# Patient Record
Sex: Male | Born: 1975 | Race: Black or African American | Hispanic: No | Marital: Married | Smoking: Never smoker
Health system: Southern US, Community
[De-identification: ages and names within clinical notes are randomized; demographics above are authoritative.]

## PROBLEM LIST (undated history)

## (undated) DIAGNOSIS — E669 Obesity, unspecified: Secondary | ICD-10-CM

## (undated) DIAGNOSIS — G4730 Sleep apnea, unspecified: Secondary | ICD-10-CM

## (undated) DIAGNOSIS — I10 Essential (primary) hypertension: Secondary | ICD-10-CM

## (undated) HISTORY — PX: DIAGNOSTIC LAPAROSCOPY: SUR761

## (undated) HISTORY — PX: THYROID SURGERY: SHX805

## (undated) HISTORY — PX: HERNIA REPAIR: SHX51

---

## 2016-06-16 ENCOUNTER — Emergency Department (HOSPITAL_COMMUNITY): Payer: BLUE CROSS/BLUE SHIELD

## 2016-06-16 ENCOUNTER — Emergency Department (HOSPITAL_COMMUNITY)
Admission: EM | Admit: 2016-06-16 | Discharge: 2016-06-16 | Disposition: A | Discharge status: Home / Self Care | Payer: BLUE CROSS/BLUE SHIELD | Attending: Emergency Medicine | Admitting: Emergency Medicine

## 2016-06-16 ENCOUNTER — Encounter (HOSPITAL_COMMUNITY): Payer: Self-pay

## 2016-06-16 DIAGNOSIS — I10 Essential (primary) hypertension: Secondary | ICD-10-CM | POA: Insufficient documentation

## 2016-06-16 DIAGNOSIS — R079 Chest pain, unspecified: Secondary | ICD-10-CM

## 2016-06-16 DIAGNOSIS — R0789 Other chest pain: Secondary | ICD-10-CM | POA: Diagnosis not present

## 2016-06-16 DIAGNOSIS — R071 Chest pain on breathing: Secondary | ICD-10-CM | POA: Diagnosis present

## 2016-06-16 HISTORY — DX: Essential (primary) hypertension: I10

## 2016-06-16 LAB — I-STAT TROPONIN, ED: Troponin i, poc: 0 ng/mL (ref 0.00–0.08)

## 2016-06-16 LAB — CBC
HEMATOCRIT: 40.1 % (ref 39.0–52.0)
HEMOGLOBIN: 13.3 g/dL (ref 13.0–17.0)
MCH: 23.7 pg — AB (ref 26.0–34.0)
MCHC: 33.2 g/dL (ref 30.0–36.0)
MCV: 71.5 fL — AB (ref 78.0–100.0)
Platelets: 244 10*3/uL (ref 150–400)
RBC: 5.61 MIL/uL (ref 4.22–5.81)
RDW: 15.9 % — AB (ref 11.5–15.5)
WBC: 8.1 10*3/uL (ref 4.0–10.5)

## 2016-06-16 LAB — BASIC METABOLIC PANEL
Anion gap: 8 (ref 5–15)
BUN: 13 mg/dL (ref 6–20)
CHLORIDE: 104 mmol/L (ref 101–111)
CO2: 27 mmol/L (ref 22–32)
Calcium: 9.4 mg/dL (ref 8.9–10.3)
Creatinine, Ser: 0.91 mg/dL (ref 0.61–1.24)
GFR calc Af Amer: >60 mL/min (ref >60)
GLUCOSE: 99 mg/dL (ref 65–99)
POTASSIUM: 4.1 mmol/L (ref 3.5–5.1)
Sodium: 139 mmol/L (ref 135–145)

## 2016-06-16 NOTE — Discharge Instructions (Signed)
SEE YOUR DOCTOR FOR RECHECK IN 2-3 DAYS IF PAIN CONTINUES. RETURN HERE IF SYMPTOMS WORSEN.

## 2016-06-16 NOTE — ED Provider Notes (Signed)
[facility]Y DEPT Provider Note   CSN: [id number] Arrival date & time: 06/16/16  0040     History   Chief Complaint Chief Complaint  Patient presents with  . Chest Pain    HPI Kyle Richmond is a 40 y.o. male.  Patient with a history of HTN presents with complaint of left chest pain that started around 9:00 last evening while at work. He experiences the pain when he takes a deep breath. No cough, fever or SOB. No DOE, nausea or vomiting. No diaphoresis. No history of heart disease. Symptoms are unchanged from time of onset. He was concerned because he has high blood pressure and he wanted to be sure this was not his heart.   The history is provided by the patient. No language interpreter was used.  Chest Pain   Pertinent negatives include no abdominal pain, no diaphoresis, no fever, no nausea and no shortness of breath.    Past Medical History:  Diagnosis Date  . Hypertension     There are no active problems to display for this patient.   Past Surgical History:  Procedure Laterality Date  . THYROID SURGERY         Home Medications    Prior to Admission medications   Not on File    Family History History reviewed. No pertinent family history.  Social History Social History  Substance Use Topics  . Smoking status: Never Smoker  . Smokeless tobacco: Never Used  . Alcohol use Yes     Allergies   Patient has no allergy information on record.   Review of Systems Review of Systems  Constitutional: Negative for chills, diaphoresis and fever.  HENT: Negative.   Respiratory: Negative.  Negative for shortness of breath.        See HPI.  Cardiovascular: Positive for chest pain.  Gastrointestinal: Negative.  Negative for abdominal pain and nausea.  Musculoskeletal: Negative.  Negative for myalgias.  Skin: Negative.   Neurological: Negative.      Physical Exam Updated Vital Signs BP 131/85   Pulse 68   Temp 98.4 F (36.9 C) (Oral)   Resp 13    SpO2 100%   Physical Exam  Constitutional: He is oriented to person, place, and time. He appears well-developed and well-nourished.  Neck: Normal range of motion.  Cardiovascular: Normal rate and regular rhythm.   No murmur heard. Pulmonary/Chest: Effort normal and breath sounds normal. He has no wheezes. He has no rales. He exhibits no tenderness.  Abdominal: Soft. There is no tenderness.  Musculoskeletal: Normal range of motion. He exhibits no edema.  Neurological: He is alert and oriented to person, place, and time.  Skin: Skin is warm and dry.  Psychiatric: He has a normal mood and affect.     ED Treatments / Results  Labs (all labs ordered are listed, but only abnormal results are displayed) Labs Reviewed  CBC - Abnormal; Notable for the following:       Result Value   MCV 71.5 (*)    MCH 23.7 (*)    RDW 15.9 (*)    All other components within normal limits  BASIC METABOLIC PANEL  I-STAT TROPOININ, ED   Results for orders placed or performed during the hospital encounter of 06/16/16  Basic metabolic panel  Result Value Ref Range   Sodium 139 135 - 145 mmol/L   Potassium 4.1 3.5 - 5.1 mmol/L   Chloride 104 101 - 111 mmol/L   CO2 27 22 - 32 mmol/L  Glucose, Bld 99 65 - 99 mg/dL   BUN 13 6 - 20 mg/dL   Creatinine, Ser 1.610.91 0.61 - 1.24 mg/dL   Calcium 9.4 8.9 - 09.610.3 mg/dL   GFR calc non Af Amer >60 >60 mL/min   GFR calc Af Amer >60 >60 mL/min   Anion gap 8 5 - 15  CBC  Result Value Ref Range   WBC 8.1 4.0 - 10.5 K/uL   RBC 5.61 4.22 - 5.81 MIL/uL   Hemoglobin 13.3 13.0 - 17.0 g/dL   HCT 04.540.1 40.939.0 - 81.152.0 %   MCV 71.5 (L) 78.0 - 100.0 fL   MCH 23.7 (L) 26.0 - 34.0 pg   MCHC 33.2 30.0 - 36.0 g/dL   RDW 91.415.9 (H) 78.211.5 - 95.615.5 %   Platelets 244 150 - 400 K/uL  I-stat troponin, ED  Result Value Ref Range   Troponin i, poc 0.00 0.00 - 0.08 ng/mL   Comment 3            EKG  EKG Interpretation None       Radiology Dg Chest 2 View  Result Date:  06/16/2016 CLINICAL DATA:  40 year old male with chest pain EXAM: CHEST  2 VIEW COMPARISON:  None. FINDINGS: The heart size and mediastinal contours are within normal limits. Both lungs are clear. The visualized skeletal structures are unremarkable. Tubing device noted over the upper abdomen . Clinical correlation is recommended. IMPRESSION: No active cardiopulmonary disease. Electronically Signed   By: Elgie CollardArash  Radparvar M.D.   On: 06/16/2016 01:38    Procedures Procedures (including critical care time)  Medications Ordered in ED Medications - No data to display   Initial Impression / Assessment and Plan / ED Course  I have reviewed the triage vital signs and the nursing notes.  Pertinent labs & imaging results that were available during my care of the patient were reviewed by me and considered in my medical decision making (see chart for details).  Clinical Course     Patient with sharp chest pain, worse with deep inspiration, atypical for pain of ACS. Normal EKG, negative troponin. No tachycardia, hypoxia or risk factors for PE.   Suspect musculoskeletal chest pain. Recommend symptomatic treatment and PCP follow up.  Final Clinical Impressions(s) / ED Diagnoses   Final diagnoses:  None  1. Nonspecific chest pain  New Prescriptions New Prescriptions   No medications on file     Elpidio AnisShari Alvita Fana, Cordelia Poche-C 06/16/16 0500    Dione Boozeavid Glick, MD 06/16/16 [phone]

## 2016-06-16 NOTE — ED Triage Notes (Signed)
Pt complaining of chest pain and shortness of breath. Pt complaining of sharp L sided chest pain. Pt denies any injury/trauma. 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

## 2016-06-26 ENCOUNTER — Encounter (HOSPITAL_COMMUNITY): Payer: Self-pay | Admitting: Emergency Medicine

## 2016-06-26 ENCOUNTER — Emergency Department (HOSPITAL_COMMUNITY): Payer: BLUE CROSS/BLUE SHIELD

## 2016-06-26 ENCOUNTER — Emergency Department (HOSPITAL_COMMUNITY)
Admission: EM | Admit: 2016-06-26 | Discharge: 2016-06-26 | Disposition: A | Discharge status: Home / Self Care | Payer: BLUE CROSS/BLUE SHIELD | Attending: Emergency Medicine | Admitting: Emergency Medicine

## 2016-06-26 DIAGNOSIS — I10 Essential (primary) hypertension: Secondary | ICD-10-CM | POA: Diagnosis not present

## 2016-06-26 DIAGNOSIS — M79641 Pain in right hand: Secondary | ICD-10-CM | POA: Insufficient documentation

## 2016-06-26 MED ORDER — OXYCODONE-ACETAMINOPHEN 5-325 MG PO TABS: 1.0000 | ORAL_TABLET | Freq: Four times a day (QID) | ORAL | 0 refills | Status: DC | PRN

## 2016-06-26 MED ORDER — NAPROXEN 500 MG PO TABS: 500.0000 mg | ORAL_TABLET | Freq: Two times a day (BID) | ORAL | 0 refills | Status: DC

## 2016-06-26 MED ORDER — KETOROLAC TROMETHAMINE 60 MG/2ML IM SOLN
60.0000 mg | Freq: Once | INTRAMUSCULAR | Status: AC
  Administered 2016-06-26: 60 mg via INTRAMUSCULAR
  Filled 2016-06-26: qty 2

## 2016-06-26 NOTE — ED Provider Notes (Signed)
Medical screening examination/treatment/procedure(s) were conducted as a shared visit with non-physician practitioner(s) and myself.  I personally evaluated the patient during the encounter.   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 No known injury. X-ray shows no fracture. No numbness or focal weakness. He is tender to palpation over the volar and dorsal aspect of the right hand low the fifth and fourth finger with some soft tissue swelling but no fluctuance, induration, erythema or warmth. No tenderness over the flexor tendon. He has difficulty extending the fingers without significant pain but can do so. Has normal grip strength and normal pulses. Extremities warm and well-perfused. Nothing at this time to suggest flexor tenosynovitis, abscess, felon, paronychia, herpetic whitlow, fracture. Could be tendinitis given he is a Curatormechanic. Will provide him with a work note, short course of narcotics and advised rest, ice, anti-inflammatories. Have discussed with him at length that if he has any symptoms of infection including fever, redness or warmth that he should return immediately to the emergency department. He is not a diabetic or immunocompromised. He is comfortable with this plan.   Layla MawKristen N Halynn Reitano, DO 06/26/16 0630

## 2016-06-26 NOTE — ED Notes (Signed)
Patient transported to X-ray 

## 2016-06-26 NOTE — ED Triage Notes (Addendum)
Pt reports right hand pain that started yesterday morning. 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 Pain not relieved by ice, elevation, or aspirin. Pt reports that he had changed a tire Sunday night but that it was fine until Monday morning. Pt radial pulse 3+, cool to touch, cap refill WNL, noticeable swelling.

## 2016-06-26 NOTE — ED Provider Notes (Signed)
[facility] Provider Note   CSN: [id number] Arrival date & time: 06/26/16  0426     History   Chief Complaint Chief Complaint  Patient presents with  . Hand Pain    HPI Kyle Richmond is a 41 y.o. male.  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 Pain was milder at onset and worsened. He is unable to sleep because of the pain. It is severely tender with palpation and movement. He has tried icing the hand and elevating it. He has tried aspirin without relief. No wrist or proximal upper extremity pain. Denies redness or wounds. Denies fever.      Past Medical History:  Diagnosis Date  . Hypertension     There are no active problems to display for this patient.   Past Surgical History:  Procedure Laterality Date  . THYROID SURGERY         Home Medications    Prior to Admission medications   Not on File    Family History No family history on file.  Social History Social History  Substance Use Topics  . Smoking status: Never Smoker  . Smokeless tobacco: Never Used  . Alcohol use Yes     Allergies   Patient has no allergy information on record.   Review of Systems Review of Systems  Constitutional: Negative for activity change.  Musculoskeletal: Positive for arthralgias and joint swelling. Negative for back pain, gait problem and neck pain.  Skin: Negative for wound.  Neurological: Negative for weakness and numbness.     Physical Exam Updated Vital Signs BP 138/95 (BP Location: Left Arm)   Pulse 77   Temp 97.9 F (36.6 C) (Oral)   Resp 16   Ht 5\' 9"  (1.753 m)   Wt 117.9 kg   SpO2 100%   BMI 38.40 kg/m   Physical Exam  Constitutional: He appears well-developed and well-nourished.  HENT:  Head: Normocephalic and atraumatic.  Eyes: Conjunctivae are normal.  Neck: Normal range of motion. Neck supple.  Cardiovascular: Normal pulses.  Exam reveals  no decreased pulses.   Pulses:      Radial pulses are 2+ on the right side, and 2+ on the left side.  Musculoskeletal: He exhibits tenderness. He exhibits no edema.       Right shoulder: Normal.       Right elbow: Normal.      Right wrist: Normal.       Right forearm: Normal.       Right hand: He exhibits decreased range of motion and tenderness. He exhibits normal capillary refill and no deformity. Normal sensation noted.       Hands: Neurological: He is alert. No sensory deficit.  Motor, sensation, and vascular distal to the injury is fully intact.   Skin: Skin is warm and dry.  Psychiatric: He has a normal mood and affect.  Nursing note and vitals reviewed.    ED Treatments / Results   Radiology Dg Hand Complete Right  Result Date: 06/26/2016 CLINICAL DATA:  Nontraumatic pain and swelling of the hand EXAM: RIGHT HAND - COMPLETE 3+ VIEW COMPARISON:  None. FINDINGS: There is no evidence of fracture or dislocation. There is no evidence of arthropathy or other focal bone abnormality. Soft tissues are unremarkable. IMPRESSION: Negative. Electronically Signed   By: Ellery Plunkaniel R Mitchell M.D.   On: 06/26/2016 05:44    Procedures Procedures (including critical care time)  Medications Ordered  in ED Medications  ketorolac (TORADOL) injection 60 mg (not administered)     Initial Impression / Assessment and Plan / ED Course  I have reviewed the triage vital signs and the nursing notes.  Pertinent labs & imaging results that were available during my care of the patient were reviewed by me and considered in my medical decision making (see chart for details).  Clinical Course    Patient seen and examined. Pain seems to be out-of-proportion to exam without erythema or warmth to suggest infection. IM toradol and x-ray.   Vital signs reviewed and are as follows: BP 138/95 (BP Location: Left Arm)   Pulse 77   Temp 97.9 F (36.6 C) (Oral)   Resp 16   Ht 5\' 9"  (1.753 m)   Wt 117.9 kg    SpO2 100%   BMI 38.40 kg/m   6:33 AM Patient d/w and seen by Dr. Elesa Massed. Agrees does not appear infected.   Plan: RICE, NSAIDs, pain medication, no working x 3 days.   Encouraged PCP f/u if not improved in 1 week.   Pt urged to return with worsening pain, worsening swelling, expanding area of redness or streaking up extremity, fever, or any other concerns.  Patient counseled on use of narcotic pain medications. Counseled not to combine these medications with others containing tylenol. Urged not to drink alcohol, drive, or perform any other activities that requires focus while taking these medications. The patient verbalizes understanding and agrees with the plan.   Final Clinical Impressions(s) / ED Diagnoses   Final diagnoses:  Pain of right hand   Inflammation in hand, 5th finger. X-ray neg. Not red, warm, or cellulitic. Doubt flexor tenosynovitis or cellulitis. Non-toxic. Treatment/follow-up as above.    New Prescriptions New Prescriptions   NAPROXEN (NAPROSYN) 500 MG TABLET    Take 1 tablet (500 mg total) by mouth 2 (two) times daily.   OXYCODONE-ACETAMINOPHEN (PERCOCET/ROXICET) 5-325 MG TABLET    Take 1-2 tablets by mouth every 6 (six) hours as needed for severe pain.     Renne Crigler, PA-C 06/26/16 [phone]

## 2016-06-26 NOTE — Discharge Instructions (Signed)
Please read and follow all provided instructions.  Your diagnoses today include:  1. Pain of right hand    Tests performed today include:  An x-ray of the affected area - does NOT show any broken bones  Vital signs. See below for your results today.   Medications prescribed:   Percocet (oxycodone/acetaminophen) - narcotic pain medication  DO NOT drive or perform any activities that require you to be awake and alert because this medicine can make you drowsy. BE VERY CAREFUL not to take multiple medicines containing Tylenol (also called acetaminophen). Doing so can lead to an overdose which can damage your liver and cause liver failure and possibly death.   Naproxen - anti-inflammatory pain medication  Do not exceed 500mg  naproxen every 12 hours, take with food  You have been prescribed an anti-inflammatory medication or NSAID. Take with food. Take smallest effective dose for the shortest duration needed for your pain. Stop taking if you experience stomach pain or vomiting.   Take any prescribed medications only as directed.  Home care instructions:   Follow any educational materials contained in this packet  Follow R.I.C.E. Protocol:  R - rest your injury   I  - use ice on injury without applying directly to skin  C - compress injury with bandage or splint  E - elevate the injury as much as possible  Follow-up instructions: Please follow-up with your primary care provider if you continue to have significant pain in 1 week. In this case you may have a more severe injury that requires further care.   Return instructions:   Please return if your fingers are numb or tingling, appear gray or blue, or you have severe pain (also elevate the arm and loosen splint or wrap if you were given one)  Return with worsening redness, swelling, drainage.   Please return to the Emergency Department if you experience worsening symptoms.   Please return if you have any other emergent  concerns.  Additional Information:  Your vital signs today were: BP 138/95 (BP Location: Left Arm)    Pulse 77    Temp 97.9 F (36.6 C) (Oral)    Resp 16    Ht 5\' 9"  (1.753 m)    Wt 117.9 kg    SpO2 100%    BMI 38.40 kg/m  If your blood pressure (BP) was elevated above 135/85 this visit, please have this repeated by your doctor within one month. --------------

## 2018-01-15 ENCOUNTER — Emergency Department (HOSPITAL_COMMUNITY)
Admission: EM | Admit: 2018-01-15 | Discharge: 2018-01-15 | Disposition: A | Discharge status: Home / Self Care | Payer: BLUE CROSS/BLUE SHIELD | Attending: Emergency Medicine | Admitting: Emergency Medicine

## 2018-01-15 ENCOUNTER — Encounter (HOSPITAL_COMMUNITY): Payer: Self-pay | Admitting: *Deleted

## 2018-01-15 ENCOUNTER — Other Ambulatory Visit: Payer: Self-pay

## 2018-01-15 ENCOUNTER — Emergency Department (HOSPITAL_COMMUNITY): Payer: BLUE CROSS/BLUE SHIELD

## 2018-01-15 DIAGNOSIS — K5900 Constipation, unspecified: Secondary | ICD-10-CM | POA: Diagnosis not present

## 2018-01-15 DIAGNOSIS — R1030 Lower abdominal pain, unspecified: Secondary | ICD-10-CM | POA: Diagnosis present

## 2018-01-15 DIAGNOSIS — R42 Dizziness and giddiness: Secondary | ICD-10-CM | POA: Insufficient documentation

## 2018-01-15 DIAGNOSIS — Z79899 Other long term (current) drug therapy: Secondary | ICD-10-CM | POA: Diagnosis not present

## 2018-01-15 DIAGNOSIS — R109 Unspecified abdominal pain: Secondary | ICD-10-CM

## 2018-01-15 HISTORY — DX: Obesity, unspecified: E66.9

## 2018-01-15 LAB — CBG MONITORING, ED: GLUCOSE-CAPILLARY: 73 mg/dL (ref 70–99)

## 2018-01-15 LAB — BASIC METABOLIC PANEL
Anion gap: 8 (ref 5–15)
BUN: 10 mg/dL (ref 6–20)
CALCIUM: 9.3 mg/dL (ref 8.9–10.3)
CO2: 26 mmol/L (ref 22–32)
Chloride: 106 mmol/L (ref 98–111)
Creatinine, Ser: 0.9 mg/dL (ref 0.61–1.24)
GFR calc Af Amer: >60 mL/min (ref >60)
GLUCOSE: 98 mg/dL (ref 70–99)
Potassium: 4.5 mmol/L (ref 3.5–5.1)
SODIUM: 140 mmol/L (ref 135–145)

## 2018-01-15 LAB — LIPASE, BLOOD: Lipase: 30 U/L (ref 11–51)

## 2018-01-15 LAB — HEPATIC FUNCTION PANEL
ALT: 18 U/L (ref 0–44)
AST: 17 U/L (ref 15–41)
Albumin: 4.2 g/dL (ref 3.5–5.0)
Alkaline Phosphatase: 70 U/L (ref 38–126)
TOTAL PROTEIN: 7.7 g/dL (ref 6.5–8.1)
Total Bilirubin: 0.9 mg/dL (ref 0.3–1.2)

## 2018-01-15 LAB — CBC
HCT: 41.8 % (ref 39.0–52.0)
Hemoglobin: 13.2 g/dL (ref 13.0–17.0)
MCH: 23.4 pg — ABNORMAL LOW (ref 26.0–34.0)
MCHC: 31.6 g/dL (ref 30.0–36.0)
MCV: 74 fL — ABNORMAL LOW (ref 78.0–100.0)
PLATELETS: 256 10*3/uL (ref 150–400)
RBC: 5.65 MIL/uL (ref 4.22–5.81)
RDW: 16.4 % — AB (ref 11.5–15.5)
WBC: 7.2 10*3/uL (ref 4.0–10.5)

## 2018-01-15 LAB — URINALYSIS, ROUTINE W REFLEX MICROSCOPIC
Bacteria, UA: NONE SEEN
Bilirubin Urine: NEGATIVE
GLUCOSE, UA: NEGATIVE mg/dL
Hgb urine dipstick: NEGATIVE
Ketones, ur: NEGATIVE mg/dL
LEUKOCYTES UA: NEGATIVE
NITRITE: NEGATIVE
PROTEIN: NEGATIVE mg/dL
SPECIFIC GRAVITY, URINE: 1.018 (ref 1.005–1.030)
pH: 8 (ref 5.0–8.0)

## 2018-01-15 MED ORDER — POLYETHYLENE GLYCOL 3350 17 G PO PACK: 17.0000 g | PACK | Freq: Every day | ORAL | 0 refills | Status: DC

## 2018-01-15 MED ORDER — MECLIZINE HCL 25 MG PO TABS: 25.0000 mg | ORAL_TABLET | Freq: Three times a day (TID) | ORAL | 0 refills | Status: DC | PRN

## 2018-01-15 MED ORDER — ACETAMINOPHEN 325 MG PO TABS
650.0000 mg | ORAL_TABLET | Freq: Once | ORAL | Status: AC
  Administered 2018-01-15: 650 mg via ORAL
  Filled 2018-01-15: qty 2

## 2018-01-15 NOTE — Discharge Instructions (Addendum)
Please take all medications as directed. For meclizine, please take a half of a tablet to 1 tablet by mouth 3 times daily as needed for dizziness.  Do not drive, work, Advertising copywriteroperate machinery while taking this medication as it can make you very tired.  Please follow up with your primary care provider within 5-7 days for re-evaluation of your symptoms. If you do not have a primary care provider, information for a healthcare clinic has been provided for you to make arrangements for follow up care. Please return to the emergency department for any new or worsening symptoms.

## 2018-01-15 NOTE — ED Triage Notes (Signed)
Pt reports having ongoing lower abd pain when having a bowel movement. Now having dizziness and weakness x 2 days. No acute distress is noted at triage.

## 2018-01-15 NOTE — ED Notes (Signed)
Patient transported to X-ray 

## 2018-01-15 NOTE — ED Provider Notes (Signed)
[facility] EMERGENCY DEPARTMENT Provider Note   CSN: [id number] Arrival date & time: 01/15/18  1215     History   Chief Complaint Chief Complaint  Patient presents with  . Dizziness  . Abdominal Pain    HPI Kyle Richmond is a 42 y.o. male.  HPI   Patient is a 42 year old male with a h/o history of hypertension and obesity presents emergency department today for evaluation of dizziness that has been ongoing for the last several days.  He reports that he has had intermittent episodes of room spinning sensation.  Episodes last for 30 seconds to minutes at a time.  They occur when he turns his head to the side, tilt his head backwards, or stands up.  He denies any constant symptoms.  Denies any headaches, lightheadedness, syncope, vision changes, numbness or weakness to the arms or legs.  He has had no symptoms like this before.  Denies any URI symptoms, ear pain, or ear fullness.  No fevers.  He does note that for the last several days he has had some intermittent lower abdominal pain that occurs when he has a bowel movement. Denies any current abdominal pain.  He states that he has had hard stools for the last several days and has had some constipation.  His last bowel movement was yesterday.  He is denying any nausea or vomiting.  No urinary symptoms. No CP or SOB.   Past Medical History:  Diagnosis Date  . Hypertension   . Obesity     There are no active problems to display for this patient.   Past Surgical History:  Procedure Laterality Date  . THYROID SURGERY          Home Medications    Prior to Admission medications   Medication Sig Start Date End Date Taking? Authorizing Provider  Cholecalciferol (VITAMIN D3) 50000 units CAPS Take 50,000 Units by mouth once a week. 12/09/17  Yes [provider]  ibuprofen (ADVIL,MOTRIN) 200 MG tablet Take 200 mg by mouth every 6 (six) hours as needed for mild pain.   Yes [provider]    levothyroxine (SYNTHROID, LEVOTHROID) 150 MCG tablet Take 150-225 mcg by mouth See admin instructions. Take 150 mg Monday-Friday and take 225 mg on Saturday and sunday 06/08/16  Yes [provider]  losartan (COZAAR) 50 MG tablet Take 50 mg by mouth daily. 06/02/16  Yes [provider]  naproxen sodium (ALEVE) 220 MG tablet Take 440 mg by mouth daily as needed.   Yes [provider]  meclizine (ANTIVERT) 25 MG tablet Take 1 tablet (25 mg total) by mouth 3 (three) times daily as needed for dizziness. 01/15/18   Ashmi Blas S, PA-C  naproxen (NAPROSYN) 500 MG tablet Take 1 tablet (500 mg total) by mouth 2 (two) times daily. Patient not taking: Reported on 01/15/2018 06/26/16   Renne Crigler, PA-C  oxyCODONE-acetaminophen (PERCOCET/ROXICET) 5-325 MG tablet Take 1-2 tablets by mouth every 6 (six) hours as needed for severe pain. Patient not taking: Reported on 01/15/2018 06/26/16   Renne Crigler, PA-C  polyethylene glycol [facility]) packet Take 17 g by mouth daily. Take one cup daily until you are having pudding consistency stools. 01/15/18   Jmarion Christiano S, PA-C    Family History History reviewed. No pertinent family history.  Social History Social History   Tobacco Use  . Smoking status: Never Smoker  . Smokeless tobacco: Never Used  Substance Use Topics  . Alcohol use: Yes  .  Drug use: No     Allergies   Patient has no known allergies.   Review of Systems Review of Systems  Constitutional: Negative for diaphoresis and fever.  HENT: Negative for congestion, ear pain, rhinorrhea and sore throat.   Eyes: Negative for visual disturbance.  Respiratory: Negative for shortness of breath.   Cardiovascular: Negative for chest pain.  Gastrointestinal: Positive for abdominal pain and constipation. Negative for diarrhea, nausea and vomiting.  Genitourinary: Negative for dysuria, flank pain, frequency, hematuria and urgency.  Musculoskeletal: Negative for  myalgias.  Skin: Negative for wound.  Neurological: Positive for dizziness. Negative for weakness, light-headedness, numbness and headaches.     Physical Exam Updated Vital Signs BP 119/85 (BP Location: Left Arm)   Pulse 69   Temp 98.7 F (37.1 C) (Oral)   Resp 18   SpO2 99%   Physical Exam  Constitutional: He appears well-developed and well-nourished.  HENT:  Head: Normocephalic and atraumatic.  Eyes: Pupils are equal, round, and reactive to light. Conjunctivae and EOM are normal.  Leftward beating nystagmus.  No horizontal nystagmus.  Neck: Neck supple.  Cardiovascular: Normal rate, regular rhythm, normal heart sounds and intact distal pulses.  No murmur heard. Pulmonary/Chest: Effort normal and breath sounds normal. No stridor. No respiratory distress. He has no wheezes. He has no rales.  Abdominal: Soft. Bowel sounds are normal. There is no rigidity, no rebound and no guarding.  No significant TTP on exam.   Musculoskeletal: He exhibits no edema.  Neurological: He is alert.  Skin: Skin is warm and dry. Capillary refill takes less than 2 seconds.  Psychiatric: He has a normal mood and affect.  Nursing note and vitals reviewed.   ED Treatments / Results  Labs (all labs ordered are listed, but only abnormal results are displayed) Labs Reviewed  CBC - Abnormal; Notable for the following components:      Result Value   MCV 74.0 (*)    MCH 23.4 (*)    RDW 16.4 (*)    All other components within normal limits  URINALYSIS, ROUTINE W REFLEX MICROSCOPIC - Abnormal; Notable for the following components:   APPearance HAZY (*)    All other components within normal limits  BASIC METABOLIC PANEL  HEPATIC FUNCTION PANEL  LIPASE, BLOOD  CBG MONITORING, ED    EKG None  Radiology Dg Abd Acute W/chest  Result Date: 01/15/2018 CLINICAL DATA:  Bilateral lower abdominal pain for the past month. EXAM: DG ABDOMEN ACUTE W/ 1V CHEST COMPARISON:  Chest x-ray dated June 16, 2016. FINDINGS: The cardiomediastinal silhouette is normal in size. Normal pulmonary vascularity. No focal consolidation, pleural effusion, or pneumothorax. There is no evidence of dilated bowel loops or free intraperitoneal air. No radiopaque calculi or other significant radiographic abnormality is seen. Gastric lap band identified with normal phi angle. Tubing connecting the reservoir to the band appears intact. Prior ventral hernia repair. No acute osseous abnormality. IMPRESSION: Negative abdominal radiographs.  No acute cardiopulmonary disease. Electronically Signed   By: Obie Dredge M.D.   On: 01/15/2018 15:26    Procedures Procedures (including critical care time)  Medications Ordered in ED Medications  acetaminophen (TYLENOL) tablet 650 mg (650 mg Oral Given 01/15/18 1730)     Initial Impression / Assessment and Plan / ED Course  I have reviewed the triage vital signs and the nursing notes.  Pertinent labs & imaging results that were available during my care of the patient were reviewed by me and considered in my  medical decision making (see chart for details).  Patient states he does not have a ride home.  Final Clinical Impressions(s) / ED Diagnoses   Final diagnoses:  Abdominal pain, unspecified abdominal location  Constipation, unspecified constipation type  Dizziness   Patient with unilateral nystagmus and normal neurologic exam.  No vertical or rotational nystagmus. Patient normal finger-nose and normal gait.  No slurred speech renal or weakness.  Doubt CVA or other central cause of vertigo.  History and physical consistent with peripheral vertigo symptoms.  We'll discharge home with meclizine. Patient instructed to followup with her primary care physician or neurology within 3 days for further evaluation. He also reported that he had had some intermittent symptoms of abdominal pain when having bowel movements.  Has had hard stools for the last several days but was able to  have a BM yesterday.  Abdominal exam is benign today.  Labs are benign.  No leukocytosis.  No anemia.  Normal electrolytes.  Normal kidney function and hepatic function. Normal lipase.  UA negative for UTI. Xray abd/chest are nonconcerning. Do not suspect obstruction or other urgent intraabdominal pathology that would require further intervention. Will send pt home with Rx for miralax. They are to return to the emergency department for new neurologic symptoms, loss of vision or other concerning symptoms.  ED Discharge Orders        Ordered    Ambulatory referral to Neurology    Comments:  An appointment is requested in approximately: 2 weeks   01/15/18 1814    polyethylene glycol (MIRALAX) packet  Daily     01/15/18 1816    meclizine (ANTIVERT) 25 MG tablet  3 times daily PRN     01/15/18 1816       Karrie MeresCouture, Tida Saner S, PA-C 01/15/18 1817    Linwood DibblesKnapp, Jon, MD 01/16/18 [phone]

## 2018-01-15 NOTE — ED Notes (Signed)
Pt CBG 73. 

## 2018-03-17 ENCOUNTER — Ambulatory Visit: Payer: BLUE CROSS/BLUE SHIELD | Admitting: Diagnostic Neuroimaging

## 2018-03-17 ENCOUNTER — Encounter

## 2018-09-19 ENCOUNTER — Other Ambulatory Visit: Payer: Self-pay | Admitting: Surgery

## 2018-09-22 ENCOUNTER — Other Ambulatory Visit: Payer: Self-pay | Admitting: Surgery

## 2018-09-22 DIAGNOSIS — Z9884 Bariatric surgery status: Secondary | ICD-10-CM

## 2018-11-14 ENCOUNTER — Other Ambulatory Visit: Payer: BLUE CROSS/BLUE SHIELD

## 2019-01-06 ENCOUNTER — Other Ambulatory Visit: Payer: Self-pay | Admitting: Surgery

## 2019-01-06 ENCOUNTER — Other Ambulatory Visit: Payer: Self-pay

## 2019-01-06 ENCOUNTER — Ambulatory Visit
Admission: RE | Admit: 2019-01-06 | Discharge: 2019-01-06 | Disposition: A | Discharge status: Home / Self Care | Payer: Commercial Managed Care - PPO | Source: Ambulatory Visit | Attending: Surgery | Admitting: Surgery

## 2019-01-06 DIAGNOSIS — Z9884 Bariatric surgery status: Secondary | ICD-10-CM

## 2019-02-04 ENCOUNTER — Other Ambulatory Visit (HOSPITAL_COMMUNITY): Payer: Self-pay | Admitting: Surgery

## 2019-02-04 DIAGNOSIS — K9509 Other complications of gastric band procedure: Secondary | ICD-10-CM

## 2019-02-06 ENCOUNTER — Other Ambulatory Visit (HOSPITAL_COMMUNITY): Payer: Self-pay | Admitting: Surgery

## 2019-02-06 ENCOUNTER — Ambulatory Visit (HOSPITAL_COMMUNITY)
Admission: RE | Admit: 2019-02-06 | Discharge: 2019-02-06 | Disposition: A | Discharge status: Home / Self Care | Payer: Commercial Managed Care - PPO | Source: Ambulatory Visit | Attending: Surgery | Admitting: Surgery

## 2019-02-06 ENCOUNTER — Other Ambulatory Visit: Payer: Self-pay

## 2019-02-06 DIAGNOSIS — K9509 Other complications of gastric band procedure: Secondary | ICD-10-CM | POA: Diagnosis present

## 2019-05-17 ENCOUNTER — Ambulatory Visit (HOSPITAL_COMMUNITY)
Admission: EM | Admit: 2019-05-17 | Discharge: 2019-05-17 | Disposition: A | Discharge status: Home / Self Care | Payer: Commercial Managed Care - PPO | Attending: Family Medicine | Admitting: Family Medicine

## 2019-05-17 ENCOUNTER — Other Ambulatory Visit: Payer: Self-pay

## 2019-05-17 ENCOUNTER — Encounter (HOSPITAL_COMMUNITY): Payer: Self-pay

## 2019-05-17 DIAGNOSIS — H9202 Otalgia, left ear: Secondary | ICD-10-CM

## 2019-05-17 DIAGNOSIS — H6982 Other specified disorders of Eustachian tube, left ear: Secondary | ICD-10-CM

## 2019-05-17 MED ORDER — IBUPROFEN 800 MG PO TABS: 800.0000 mg | ORAL_TABLET | Freq: Three times a day (TID) | ORAL | 0 refills | Status: DC

## 2019-05-17 MED ORDER — FLUTICASONE PROPIONATE 50 MCG/ACT NA SUSP: 2.0000 | Freq: Every day | NASAL | 2 refills | Status: DC

## 2019-05-17 NOTE — ED Provider Notes (Signed)
[facility]    CSN: [id number] Arrival date & time: 05/17/19  1017      History   Chief Complaint Chief Complaint  Patient presents with  . Otalgia    HPI [facility] is a 43 y.o. male.   HPI  Ear pain, either ear, off and on for about a week.  Last night he woke up with pain in his left ear.  He states it was severe.  Hearing is normal.  He denies any cough cold runny nose or sore throat symptoms.  No drainage from the ear.  No history of ear problems  Past Medical History:  Diagnosis Date  . Hypertension   . Obesity     There are no active problems to display for this patient.   Past Surgical History:  Procedure Laterality Date  . THYROID SURGERY         Home Medications    Prior to Admission medications   Medication Sig Start Date End Date Taking? Authorizing Provider  Cholecalciferol (VITAMIN D3) 50000 units CAPS Take 50,000 Units by mouth once a week. 12/09/17   [provider]  fluticasone (FLONASE) 50 MCG/ACT nasal spray Place 2 sprays into both nostrils daily. 05/17/19   Eustace Moore, MD  ibuprofen (ADVIL) 800 MG tablet Take 1 tablet (800 mg total) by mouth 3 (three) times daily. 05/17/19   Eustace Moore, MD  ibuprofen (ADVIL,MOTRIN) 200 MG tablet Take 200 mg by mouth every 6 (six) hours as needed for mild pain.    [provider]  levothyroxine (SYNTHROID, LEVOTHROID) 150 MCG tablet Take 150-225 mcg by mouth See admin instructions. Take 150 mg Monday-Friday and take 225 mg on Saturday and sunday 06/08/16   [provider]  losartan (COZAAR) 50 MG tablet Take 50 mg by mouth daily. 06/02/16   [provider]  polyethylene glycol (MIRALAX) packet Take 17 g by mouth daily. Take one cup daily until you are having pudding consistency stools. 01/15/18   Couture, Cortni S, PA-C    Family History History reviewed. No pertinent family history.  Social History Social History   Tobacco Use  . Smoking  status: Never Smoker  . Smokeless tobacco: Never Used  Substance Use Topics  . Alcohol use: Yes  . Drug use: No     Allergies   Patient has no known allergies.   Review of Systems Review of Systems  Constitutional: Negative for chills and fever.  HENT: Positive for ear pain. Negative for ear discharge, hearing loss and sore throat.   Eyes: Negative for pain and visual disturbance.  Respiratory: Negative for cough and shortness of breath.   Cardiovascular: Negative for chest pain and palpitations.  Gastrointestinal: Negative for abdominal pain and vomiting.  Genitourinary: Negative for dysuria and hematuria.  Musculoskeletal: Negative for arthralgias and back pain.  Skin: Negative for color change and rash.  Neurological: Negative for seizures and syncope.  All other systems reviewed and are negative.    Physical Exam Triage Vital Signs ED Triage Vitals  Enc Vitals Group     BP 05/17/19 1110 (!) 162/99     Pulse Rate 05/17/19 1110 75     Resp 05/17/19 1110 20     Temp 05/17/19 1110 98.4 F (36.9 C)     Temp Source 05/17/19 1110 Oral     SpO2 05/17/19 1110 100 %     Weight 05/17/19 1107 (!) 302 lb (137 kg)     Height --  Head Circumference --      Peak Flow --      Pain Score 05/17/19 1107 7     Pain Loc --      Pain Edu? --      Excl. in [place]? --    No data found.  Updated Vital Signs BP (!) 162/99 (BP Location: Right Arm)   Pulse 75   Temp 98.4 F (36.9 C) (Oral)   Resp 20   Wt (!) 137 kg   SpO2 100%   BMI 44.60 kg/m     Physical Exam Constitutional:      General: He is not in acute distress.    Appearance: He is well-developed. He is obese.  HENT:     Head: Normocephalic and atraumatic.     Right Ear: Tympanic membrane, ear canal and external ear normal. There is no impacted cerumen.     Left Ear: Tympanic membrane, ear canal and external ear normal. There is no impacted cerumen.     Nose: Nose normal. No congestion.     Mouth/Throat:      Mouth: Mucous membranes are moist.     Pharynx: No posterior oropharyngeal erythema.  Eyes:     Conjunctiva/sclera: Conjunctivae normal.     Pupils: Pupils are equal, round, and reactive to light.  Neck:     Musculoskeletal: Normal range of motion.  Cardiovascular:     Rate and Rhythm: Normal rate.  Pulmonary:     Effort: Pulmonary effort is normal. No respiratory distress.  Abdominal:     General: There is no distension.     Palpations: Abdomen is soft.  Musculoskeletal: Normal range of motion.  Skin:    General: Skin is warm and dry.  Neurological:     Mental Status: He is alert.      UC Treatments / Results  Labs (all labs ordered are listed, but only abnormal results are displayed) Labs Reviewed - No data to display  EKG   Radiology No results found.  Procedures Procedures (including critical care time)  Medications Ordered in UC Medications - No data to display  Initial Impression / Assessment and Plan / UC Course  I have reviewed the triage vital signs and the nursing notes.  Pertinent labs & imaging results that were available during my care of the patient were reviewed by me and considered in my medical decision making (see chart for details).     With normal ear exam likely he has eustachian tube dysfunction.  Will treat with Flonase and follow-up Final Clinical Impressions(s) / UC Diagnoses   Final diagnoses:  Left ear pain  Dysfunction of left eustachian tube     Discharge Instructions     Drink plenty of fluids Use the nasal spray for the next few days until the ear pain goes away.  Then use as needed Take ibuprofen as needed for pain   ED Prescriptions    Medication Sig Dispense Auth. Provider   fluticasone (FLONASE) 50 MCG/ACT nasal spray Place 2 sprays into both nostrils daily. 16 g Raylene Everts, MD   ibuprofen (ADVIL) 800 MG tablet Take 1 tablet (800 mg total) by mouth 3 (three) times daily. 21 tablet Raylene Everts, MD      PDMP not reviewed this encounter.   Raylene Everts, MD 05/17/19 [phone]

## 2019-05-17 NOTE — Discharge Instructions (Signed)
Drink plenty of fluids Use the nasal spray for the next few days until the ear pain goes away.  Then use as needed Take ibuprofen as needed for pain

## 2019-05-17 NOTE — ED Notes (Addendum)
B/p 162/99 reported to the RN's ( Megan and [place] ) Pt states he has not taken his b/p meds yet. He forgot too.

## 2019-05-17 NOTE — ED Triage Notes (Signed)
Pt states he has left ear pain. This started last night.

## 2019-07-07 ENCOUNTER — Ambulatory Visit
Admission: RE | Admit: 2019-07-07 | Discharge: 2019-07-07 | Disposition: A | Discharge status: Home / Self Care | Payer: Commercial Managed Care - PPO | Source: Ambulatory Visit | Attending: Internal Medicine | Admitting: Internal Medicine

## 2019-07-07 ENCOUNTER — Other Ambulatory Visit: Payer: Self-pay | Admitting: Internal Medicine

## 2019-07-07 DIAGNOSIS — R109 Unspecified abdominal pain: Secondary | ICD-10-CM

## 2019-08-20 NOTE — Patient Instructions (Addendum)
DUE TO COVID-19 ONLY ONE VISITOR IS ALLOWED TO COME WITH YOU AND STAY IN THE WAITING ROOM ONLY DURING PRE OP AND PROCEDURE DAY OF SURGERY. THE 1 VISITOR MAY VISIT WITH YOU AFTER SURGERY IN YOUR PRIVATE ROOM DURING VISITING HOURS ONLY!  YOU NEED TO HAVE A COVID 19 TEST ON: 09/04/19 @ 1:00 pm        , THIS TEST MUST BE DONE BEFORE SURGERY, COME  [address], [place] , [address].  [facility]) ONCE YOUR COVID TEST IS COMPLETED, PLEASE BEGIN THE QUARANTINE INSTRUCTIONS AS OUTLINED IN YOUR HANDOUT.                [facility]     Your procedure is scheduled on: 09/08/19   Report to [facility] Main  Entrance   Report to SHORT STAY at: 5:30 AM     Call this number if you have problems the morning of surgery [phone]    Remember:   Do not eat food or drink liquids :After Midnight.   BRUSH YOUR TEETH MORNING OF SURGERY AND RINSE YOUR MOUTH OUT, NO CHEWING GUM CANDY OR MINTS.     Take these medicines the morning of surgery with A SIP OF WATER: levothyroxine                               You may not have any metal on your body including hair pins and              piercings  Do not wear jewelry,lotions, powders or perfumes, deodorant             Men may shave face and neck.   Do not bring valuables to the hospital. [facility] IS NOT             RESPONSIBLE   FOR VALUABLES.  Contacts, dentures or bridgework may not be worn into surgery.  Leave suitcase in the car. After surgery it may be brought to your room.     Patients discharged the day of surgery will not be allowed to drive home. IF YOU ARE HAVING SURGERY AND GOING HOME THE SAME DAY, YOU MUST HAVE AN ADULT TO DRIVE YOU HOME AND BE WITH YOU FOR 24 HOURS. YOU MAY GO HOME BY TAXI OR UBER OR ORTHERWISE, BUT AN ADULT MUST ACCOMPANY YOU HOME AND STAY WITH YOU FOR 24 HOURS.  Name and phone number of your driver:  Special Instructions: N/A              Please read over the following fact sheets you  were given: _____________________________________________________________________             [facility] - Preparing for Surgery Before surgery, you can play an important role.  Because skin is not sterile, your skin needs to be as free of germs as possible.  You can reduce the number of germs on your skin by washing with CHG (chlorahexidine gluconate) soap before surgery.  CHG is an antiseptic cleaner which kills germs and bonds with the skin to continue killing germs even after washing. Please DO NOT use if you have an allergy to CHG or antibacterial soaps.  If your skin becomes reddened/irritated stop using the CHG and inform your nurse when you arrive at Short Stay. Do not shave (including legs and underarms) for at least 48 hours prior to the first CHG shower.  You  may shave your face/neck. Please follow these instructions carefully:  1.  Shower with CHG Soap the night before surgery and the  morning of Surgery.  2.  If you choose to wash your hair, wash your hair first as usual with your  normal  shampoo.  3.  After you shampoo, rinse your hair and body thoroughly to remove the  shampoo.                           4.  Use CHG as you would any other liquid soap.  You can apply chg directly  to the skin and wash                       Gently with a scrungie or clean washcloth.  5.  Apply the CHG Soap to your body ONLY FROM THE NECK DOWN.   Do not use on face/ open                           Wound or open sores. Avoid contact with eyes, ears mouth and genitals (private parts).                       Wash face,  Genitals (private parts) with your normal soap.             6.  Wash thoroughly, paying special attention to the area where your surgery  will be performed.  7.  Thoroughly rinse your body with warm water from the neck down.  8.  DO NOT shower/wash with your normal soap after using and rinsing off  the CHG Soap.                9.  Pat yourself dry with a clean towel.            10.  Wear  clean pajamas.            11.  Place clean sheets on your bed the night of your first shower and do not  sleep with pets. Day of Surgery : Do not apply any lotions/deodorants the morning of surgery.  Please wear clean clothes to the hospital/surgery center.  FAILURE TO FOLLOW THESE INSTRUCTIONS MAY RESULT IN THE CANCELLATION OF YOUR SURGERY PATIENT SIGNATURE_________________________________  NURSE SIGNATURE__________________________________  ________________________________________________________________________

## 2019-08-20 NOTE — Progress Notes (Addendum)
Can you please place some orders for the upcomming surgery.Pt. PST appointment is 08/21/19.Thank you.

## 2019-08-21 ENCOUNTER — Encounter (HOSPITAL_COMMUNITY): Admission: RE | Admit: 2019-08-21 | Payer: Commercial Managed Care - PPO | Source: Ambulatory Visit

## 2019-08-21 ENCOUNTER — Other Ambulatory Visit: Payer: Self-pay

## 2019-08-21 ENCOUNTER — Encounter (HOSPITAL_COMMUNITY)
Admission: RE | Admit: 2019-08-21 | Discharge: 2019-08-21 | Disposition: A | Discharge status: Home / Self Care | Payer: Commercial Managed Care - PPO | Source: Ambulatory Visit | Attending: Surgery | Admitting: Surgery

## 2019-08-21 ENCOUNTER — Encounter (HOSPITAL_COMMUNITY): Payer: Self-pay

## 2019-08-21 DIAGNOSIS — Z01818 Encounter for other preprocedural examination: Secondary | ICD-10-CM | POA: Diagnosis not present

## 2019-08-21 HISTORY — DX: Sleep apnea, unspecified: G47.30

## 2019-08-21 NOTE — Progress Notes (Addendum)
PCP - Dr. Nehemiah Settle R Cardiologist -   Chest x-ray -  EKG -  Stress Test -  ECHO -  Cardiac Cath -   Sleep Study - yes CPAP - yes  Fasting Blood Sugar -  Checks Blood Sugar _____ times a day  Blood Thinner Instructions: Aspirin Instructions: Last Dose:  Anesthesia review:   Patient denies shortness of breath, fever, cough and chest pain at PAT appointment   Patient verbalized understanding of instructions that were given to them at the PAT appointment. Patient was also instructed that they will need to review over the PAT instructions again at home before surgery.

## 2019-08-24 ENCOUNTER — Ambulatory Visit: Payer: Commercial Managed Care - PPO

## 2019-08-24 ENCOUNTER — Encounter: Payer: Commercial Managed Care - PPO | Attending: Surgery | Admitting: Skilled Nursing Facility1

## 2019-08-24 ENCOUNTER — Other Ambulatory Visit: Payer: Self-pay

## 2019-08-24 DIAGNOSIS — E669 Obesity, unspecified: Secondary | ICD-10-CM | POA: Diagnosis not present

## 2019-08-24 NOTE — Progress Notes (Signed)
Pre-Operative Nutrition Class:  Appt start time: 7903   End time:  1830.  Patient was seen on 08/24/2019 for Pre-Operative Bariatric Surgery [facility]   Surgery date:  Surgery type:  [facility] weight at [facility]:  Weight today: 338.2  Samples given per MNT protocol. Patient educated on appropriate usage: Bariatric Advantage Multivitamin Lot # [id number] Exp:08/21  Bariatric Advantage Calcium  Lot # [id number] Exp: 06/27/2020   Protein Shake Lot # [id number] Exp: 11/09/2020  The following the learning objectives were met by the patient during this course:  Identify Pre-Op Dietary Goals and will begin 2 weeks pre-operatively  Identify appropriate sources of fluids and proteins   State protein recommendations and appropriate sources pre and post-operatively  Identify Post-Operative Dietary Goals and will follow for 2 weeks post-operatively  Identify appropriate multivitamin and calcium sources  Describe the need for physical activity post-operatively and will follow MD recommendations  State when to call healthcare provider regarding medication questions or post-operative complications  Handouts given during class include:  Pre-Op Bariatric Surgery Diet Handout  Protein Shake Handout  Post-Op Bariatric Surgery Nutrition Handout  BELT Program Information Flyer  Support Group Information Flyer  [facility] Bariatric Supplements Price List  Follow-Up Plan: Patient will follow-up at [facility] 2 weeks post operatively for diet advancement per MD.

## 2019-08-28 ENCOUNTER — Encounter (HOSPITAL_COMMUNITY)
Admission: RE | Admit: 2019-08-28 | Discharge: 2019-08-28 | Disposition: A | Discharge status: Home / Self Care | Payer: Commercial Managed Care - PPO | Source: Ambulatory Visit | Attending: Surgery | Admitting: Surgery

## 2019-08-28 ENCOUNTER — Other Ambulatory Visit: Payer: Self-pay

## 2019-08-28 DIAGNOSIS — Z01818 Encounter for other preprocedural examination: Secondary | ICD-10-CM | POA: Diagnosis present

## 2019-08-28 DIAGNOSIS — R9431 Abnormal electrocardiogram [ECG] [EKG]: Secondary | ICD-10-CM | POA: Diagnosis not present

## 2019-08-28 LAB — CBC
HCT: 46.8 % (ref 39.0–52.0)
Hemoglobin: 14.6 g/dL (ref 13.0–17.0)
MCH: 23.1 pg — ABNORMAL LOW (ref 26.0–34.0)
MCHC: 31.2 g/dL (ref 30.0–36.0)
MCV: 73.9 fL — ABNORMAL LOW (ref 80.0–100.0)
Platelets: 313 10*3/uL (ref 150–400)
RBC: 6.33 MIL/uL — ABNORMAL HIGH (ref 4.22–5.81)
RDW: 17.9 % — ABNORMAL HIGH (ref 11.5–15.5)
WBC: 8.7 10*3/uL (ref 4.0–10.5)
nRBC: 0 % (ref 0.0–0.2)

## 2019-08-28 LAB — BASIC METABOLIC PANEL
Anion gap: 9 (ref 5–15)
BUN: 19 mg/dL (ref 6–20)
CO2: 25 mmol/L (ref 22–32)
Calcium: 9.2 mg/dL (ref 8.9–10.3)
Chloride: 105 mmol/L (ref 98–111)
Creatinine, Ser: 0.95 mg/dL (ref 0.61–1.24)
GFR calc Af Amer: >60 mL/min (ref >60)
GFR calc non Af Amer: >60 mL/min (ref >60)
Glucose, Bld: 95 mg/dL (ref 70–99)
Potassium: 4.2 mmol/L (ref 3.5–5.1)
Sodium: 139 mmol/L (ref 135–145)

## 2019-09-03 NOTE — Progress Notes (Signed)
Patient  Called and reminded of new date and time of surgery on 09/08/19.  Patient aware of arival time.  Patient aware to take Levothyroxine am of surgery with sip of water.  Patient aware of COVID test on 09/04/19 at 100pm.  Patient asked about Gatorade preop.  No orders seend for Gatorade preop.  Patient stated he received in Dietician class.  Instructed patient to call Dietician class or office of Dr Daphine Deutscher regarding this. Patient voiced understanding.

## 2019-09-04 ENCOUNTER — Other Ambulatory Visit (HOSPITAL_COMMUNITY)
Admission: RE | Admit: 2019-09-04 | Discharge: 2019-09-04 | Disposition: A | Discharge status: Home / Self Care | Payer: Commercial Managed Care - PPO | Source: Ambulatory Visit | Attending: Surgery | Admitting: Surgery

## 2019-09-04 DIAGNOSIS — Z01812 Encounter for preprocedural laboratory examination: Secondary | ICD-10-CM | POA: Diagnosis present

## 2019-09-04 DIAGNOSIS — Z20822 Contact with and (suspected) exposure to covid-19: Secondary | ICD-10-CM | POA: Insufficient documentation

## 2019-09-04 LAB — SARS CORONAVIRUS 2 (TAT 6-24 HRS): SARS Coronavirus 2: NEGATIVE

## 2019-09-07 ENCOUNTER — Ambulatory Visit: Payer: Self-pay | Admitting: General Surgery

## 2019-09-07 NOTE — H&P (Signed)
Chief Complaint:  lapband slippage and obesity  History of Present Illness:  Kyle Richmond is an 44 y.o. male who had lapband placement in [facility] followed by ventral hernia repair with mesh.  He has had difficulty with fills.  He drives a truck for [employer].    He has been counseled on lapband removal and the complications that could delay sleeve conversion.  Also the mesh may complicate these operations.    Past Medical History:  Diagnosis Date  . Hypertension   . Obesity   . Sleep apnea    cpap    Past Surgical History:  Procedure Laterality Date  . DIAGNOSTIC LAPAROSCOPY     bands  . HERNIA REPAIR    . THYROID SURGERY      No current facility-administered medications for this encounter.   Current Outpatient Medications  Medication Sig Dispense Refill  . levothyroxine (SYNTHROID, LEVOTHROID) 150 MCG tablet Take 150 mcg by mouth daily before breakfast.   0  . losartan (COZAAR) 50 MG tablet Take 50 mg by mouth daily.  1  . Multiple Vitamin (MULTIVITAMIN WITH MINERALS) TABS tablet Take 1 tablet by mouth daily.    . traMADol (ULTRAM) 50 MG tablet Take 50 mg by mouth every 6 (six) hours as needed.    . fluticasone (FLONASE) 50 MCG/ACT nasal spray Place 2 sprays into both nostrils daily. (Patient not taking: Reported on 08/18/2019) 16 g 2  . ibuprofen (ADVIL) 800 MG tablet Take 1 tablet (800 mg total) by mouth 3 (three) times daily. (Patient not taking: Reported on 08/18/2019) 21 tablet 0  . polyethylene glycol (MIRALAX) packet Take 17 g by mouth daily. Take one cup daily until you are having pudding consistency stools. (Patient not taking: Reported on 08/18/2019) 14 each 0   Patient has no known allergies. No family history on file. Social History:   reports that he has never smoked. He has never used smokeless tobacco. He reports current alcohol use. He reports that he does not use drugs.   REVIEW OF SYSTEMS : Negative except for see problem list  Physical Exam:   There were no vitals  taken for this visit. There is no height or weight on file to calculate BMI.  Gen:  WDWN AAM NAD  Neurological: Alert and oriented to person, place, and time. Motor and sensory function is grossly intact  Head: Normocephalic and atraumatic.  Eyes: Conjunctivae are normal. Pupils are equal, round, and reactive to light. No scleral icterus.  Neck: Normal range of motion. Neck supple. No tracheal deviation or thyromegaly present.  Cardiovascular:  SR without murmurs or gallops.  No carotid bruits Breast:  Not examined Respiratory: Effort normal.  No respiratory distress. No chest wall tenderness. Breath sounds normal.  No wheezes, rales or rhonchi.  Abdomen:  Obese with multiple scars GU:  Not examined Musculoskeletal: Normal range of motion. Extremities are nontender. No cyanosis, edema or clubbing noted Lymphadenopathy: No cervical, preauricular, postauricular or axillary adenopathy is present Skin: Skin is warm and dry. No rash noted. No diaphoresis. No erythema. No pallor. Pscyh: Normal mood and affect. Behavior is normal. Judgment and thought content normal.   LABORATORY RESULTS: No results found for this or any previous visit (from the past 48 hour(s)).   RADIOLOGY RESULTS: No results found.  Problem List: There are no problems to display for this patient.   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 Daphine Deutscher, MD, FACS  [facility] [phone] beeper [phone]  09/07/2019 2:24 PM

## 2019-09-07 NOTE — Anesthesia Preprocedure Evaluation (Addendum)
Anesthesia Evaluation  Patient identified by MRN, date of birth, ID band Patient awake    Reviewed: Allergy & Precautions, H&P , NPO status , Patient's Chart, lab work & pertinent test results  Airway Mallampati: III  TM Distance: >3 FB Neck ROM: Full    Dental no notable dental hx. (+) Teeth Intact, Dental Advisory Given   Pulmonary sleep apnea and Continuous Positive Airway Pressure Ventilation ,    Pulmonary exam normal breath sounds clear to auscultation       Cardiovascular Exercise Tolerance: Good hypertension, Pt. on medications  Rhythm:Regular Rate:Normal     Neuro/Psych negative neurological ROS  negative psych ROS   GI/Hepatic negative GI ROS, Neg liver ROS,   Endo/Other  Morbid obesity  Renal/GU negative Renal ROS  negative genitourinary   Musculoskeletal   Abdominal   Peds  Hematology negative hematology ROS (+)   Anesthesia Other Findings   Reproductive/Obstetrics negative OB ROS                            Anesthesia Physical Anesthesia Plan  ASA: III  Anesthesia Plan: General   Post-op Pain Management:    Induction: Intravenous  PONV Risk Score and Plan: 3 and Ondansetron, Dexamethasone and Midazolam  Airway Management Planned: Oral ETT  Additional Equipment:   Intra-op Plan:   Post-operative Plan: Extubation in OR  Informed Consent: I have reviewed the patients History and Physical, chart, labs and discussed the procedure including the risks, benefits and alternatives for the proposed anesthesia with the patient or authorized representative who has indicated his/her understanding and acceptance.     Dental advisory given  Plan Discussed with: CRNA  Anesthesia Plan Comments:         Anesthesia Quick Evaluation

## 2019-09-08 ENCOUNTER — Inpatient Hospital Stay (HOSPITAL_COMMUNITY)
Admission: RE | Admit: 2019-09-08 | Discharge: 2019-09-10 | DRG: 989 | Disposition: A | Discharge status: Home / Self Care | Payer: Commercial Managed Care - PPO | Attending: Surgery | Admitting: Surgery

## 2019-09-08 ENCOUNTER — Inpatient Hospital Stay (HOSPITAL_COMMUNITY): Payer: Commercial Managed Care - PPO | Admitting: Anesthesiology

## 2019-09-08 ENCOUNTER — Encounter (HOSPITAL_COMMUNITY): Payer: Self-pay | Admitting: Surgery

## 2019-09-08 ENCOUNTER — Encounter (HOSPITAL_COMMUNITY)
Admission: RE | Disposition: A | Discharge status: Home / Self Care | Payer: Self-pay | Source: Home / Self Care | Attending: Surgery

## 2019-09-08 ENCOUNTER — Other Ambulatory Visit: Payer: Self-pay

## 2019-09-08 DIAGNOSIS — I10 Essential (primary) hypertension: Secondary | ICD-10-CM | POA: Diagnosis present

## 2019-09-08 DIAGNOSIS — Z79899 Other long term (current) drug therapy: Secondary | ICD-10-CM

## 2019-09-08 DIAGNOSIS — T85528A Displacement of other gastrointestinal prosthetic devices, implants and grafts, initial encounter: Secondary | ICD-10-CM | POA: Diagnosis present

## 2019-09-08 DIAGNOSIS — G4730 Sleep apnea, unspecified: Secondary | ICD-10-CM | POA: Diagnosis present

## 2019-09-08 DIAGNOSIS — Z79890 Hormone replacement therapy (postmenopausal): Secondary | ICD-10-CM

## 2019-09-08 DIAGNOSIS — E039 Hypothyroidism, unspecified: Secondary | ICD-10-CM | POA: Diagnosis present

## 2019-09-08 DIAGNOSIS — Z9884 Bariatric surgery status: Secondary | ICD-10-CM

## 2019-09-08 HISTORY — PX: LAPAROSCOPIC GASTRIC BAND REMOVAL WITH LAPAROSCOPIC GASTRIC SLEEVE RESECTION: SHX6498

## 2019-09-08 LAB — TYPE AND SCREEN
ABO/RH(D): B POS
Antibody Screen: NEGATIVE

## 2019-09-08 LAB — COMPREHENSIVE METABOLIC PANEL
ALT: 25 U/L (ref 0–44)
AST: 21 U/L (ref 15–41)
Albumin: 4.4 g/dL (ref 3.5–5.0)
Alkaline Phosphatase: 68 U/L (ref 38–126)
Anion gap: 12 (ref 5–15)
BUN: 16 mg/dL (ref 6–20)
CO2: 25 mmol/L (ref 22–32)
Calcium: 9.4 mg/dL (ref 8.9–10.3)
Chloride: 103 mmol/L (ref 98–111)
Creatinine, Ser: 0.99 mg/dL (ref 0.61–1.24)
GFR calc Af Amer: >60 mL/min (ref >60)
GFR calc non Af Amer: >60 mL/min (ref >60)
Glucose, Bld: 98 mg/dL (ref 70–99)
Potassium: 3.7 mmol/L (ref 3.5–5.1)
Sodium: 140 mmol/L (ref 135–145)
Total Bilirubin: 0.6 mg/dL (ref 0.3–1.2)
Total Protein: 7.4 g/dL (ref 6.5–8.1)

## 2019-09-08 LAB — CBC WITH DIFFERENTIAL/PLATELET
Abs Immature Granulocytes: 0.04 10*3/uL (ref 0.00–0.07)
Basophils Absolute: 0.1 10*3/uL (ref 0.0–0.1)
Basophils Relative: 1 %
Eosinophils Absolute: 0.2 10*3/uL (ref 0.0–0.5)
Eosinophils Relative: 3 %
HCT: 45.4 % (ref 39.0–52.0)
Hemoglobin: 14 g/dL (ref 13.0–17.0)
Immature Granulocytes: 1 %
Lymphocytes Relative: 22 %
Lymphs Abs: 1.8 10*3/uL (ref 0.7–4.0)
MCH: 23 pg — ABNORMAL LOW (ref 26.0–34.0)
MCHC: 30.8 g/dL (ref 30.0–36.0)
MCV: 74.5 fL — ABNORMAL LOW (ref 80.0–100.0)
Monocytes Absolute: 0.6 10*3/uL (ref 0.1–1.0)
Monocytes Relative: 7 %
Neutro Abs: 5.7 10*3/uL (ref 1.7–7.7)
Neutrophils Relative %: 66 %
Platelets: 294 10*3/uL (ref 150–400)
RBC: 6.09 MIL/uL — ABNORMAL HIGH (ref 4.22–5.81)
RDW: 17.9 % — ABNORMAL HIGH (ref 11.5–15.5)
WBC: 8.5 10*3/uL (ref 4.0–10.5)
nRBC: 0 % (ref 0.0–0.2)

## 2019-09-08 LAB — CBC
HCT: 43.6 % (ref 39.0–52.0)
Hemoglobin: 13.4 g/dL (ref 13.0–17.0)
MCH: 23.1 pg — ABNORMAL LOW (ref 26.0–34.0)
MCHC: 30.7 g/dL (ref 30.0–36.0)
MCV: 75.3 fL — ABNORMAL LOW (ref 80.0–100.0)
Platelets: 286 10*3/uL (ref 150–400)
RBC: 5.79 MIL/uL (ref 4.22–5.81)
RDW: 16.9 % — ABNORMAL HIGH (ref 11.5–15.5)
WBC: 16.5 10*3/uL — ABNORMAL HIGH (ref 4.0–10.5)
nRBC: 0 % (ref 0.0–0.2)

## 2019-09-08 LAB — HEMOGLOBIN AND HEMATOCRIT, BLOOD
HCT: 44 % (ref 39.0–52.0)
Hemoglobin: 13.5 g/dL (ref 13.0–17.0)

## 2019-09-08 LAB — CREATININE, SERUM
Creatinine, Ser: 1.08 mg/dL (ref 0.61–1.24)
GFR calc Af Amer: >60 mL/min (ref >60)
GFR calc non Af Amer: >60 mL/min (ref >60)

## 2019-09-08 LAB — ABO/RH: ABO/RH(D): B POS

## 2019-09-08 SURGERY — LAPAROSCOPIC GASTRIC BAND REMOVAL WITH LAPAROSCOPIC GASTRIC SLEEVE RESECTION
Anesthesia: General | Site: Abdomen

## 2019-09-08 MED ORDER — ENSURE MAX PROTEIN PO LIQD
2.0000 [oz_av] | ORAL | Status: DC
  Administered 2019-09-09 (×7): 2 [oz_av] via ORAL

## 2019-09-08 MED ORDER — HYDROMORPHONE HCL 1 MG/ML IJ SOLN
0.2500 mg | INTRAMUSCULAR | Status: DC | PRN
  Administered 2019-09-08: 0.5 mg via INTRAVENOUS

## 2019-09-08 MED ORDER — ROCURONIUM BROMIDE 10 MG/ML (PF) SYRINGE
PREFILLED_SYRINGE | INTRAVENOUS | Status: AC
  Filled 2019-09-08: qty 10

## 2019-09-08 MED ORDER — PROPOFOL 10 MG/ML IV BOLUS
INTRAVENOUS | Status: AC
  Filled 2019-09-08: qty 20

## 2019-09-08 MED ORDER — LACTATED RINGERS IR SOLN
Status: DC | PRN
  Administered 2019-09-08: 1000 mL

## 2019-09-08 MED ORDER — METOPROLOL TARTRATE 5 MG/5ML IV SOLN
5.0000 mg | Freq: Four times a day (QID) | INTRAVENOUS | Status: DC | PRN
  Administered 2019-09-10: 5 mg via INTRAVENOUS
  Filled 2019-09-08: qty 5

## 2019-09-08 MED ORDER — BUPIVACAINE LIPOSOME 1.3 % IJ SUSP
20.0000 mL | Freq: Once | INTRAMUSCULAR | Status: DC
  Filled 2019-09-08: qty 20

## 2019-09-08 MED ORDER — HYDROMORPHONE HCL 1 MG/ML IJ SOLN
INTRAMUSCULAR | Status: AC
  Administered 2019-09-08: 0.5 mg via INTRAVENOUS
  Filled 2019-09-08: qty 1

## 2019-09-08 MED ORDER — LIDOCAINE 20MG/ML (2%) 15 ML SYRINGE OPTIME
INTRAMUSCULAR | Status: DC | PRN
  Administered 2019-09-08: 1.5 mg/kg/h via INTRAVENOUS

## 2019-09-08 MED ORDER — ONDANSETRON HCL 4 MG/2ML IJ SOLN: 4.0000 mg | INTRAMUSCULAR | Status: DC | PRN

## 2019-09-08 MED ORDER — SUCCINYLCHOLINE CHLORIDE 20 MG/ML IJ SOLN
INTRAMUSCULAR | Status: DC | PRN
  Administered 2019-09-08: 140 mg via INTRAVENOUS

## 2019-09-08 MED ORDER — EPHEDRINE SULFATE 50 MG/ML IJ SOLN
INTRAMUSCULAR | Status: DC | PRN
  Administered 2019-09-08: 10 mg via INTRAVENOUS

## 2019-09-08 MED ORDER — KETAMINE HCL 10 MG/ML IJ SOLN
INTRAMUSCULAR | Status: DC | PRN
  Administered 2019-09-08: 10 mg via INTRAVENOUS
  Administered 2019-09-08: 40 mg via INTRAVENOUS

## 2019-09-08 MED ORDER — ACETAMINOPHEN 500 MG PO TABS
1000.0000 mg | ORAL_TABLET | ORAL | Status: AC
  Administered 2019-09-08: 1000 mg via ORAL
  Filled 2019-09-08: qty 2

## 2019-09-08 MED ORDER — MORPHINE SULFATE (PF) 2 MG/ML IV SOLN
1.0000 mg | INTRAVENOUS | Status: DC | PRN
  Administered 2019-09-08: 2 mg via INTRAVENOUS
  Filled 2019-09-08: qty 1

## 2019-09-08 MED ORDER — LIDOCAINE HCL (CARDIAC) PF 100 MG/5ML IV SOSY
PREFILLED_SYRINGE | INTRAVENOUS | Status: DC | PRN
  Administered 2019-09-08: 60 mg via INTRAVENOUS

## 2019-09-08 MED ORDER — PROPOFOL 10 MG/ML IV BOLUS
INTRAVENOUS | Status: DC | PRN
  Administered 2019-09-08: 200 mg via INTRAVENOUS

## 2019-09-08 MED ORDER — GABAPENTIN 300 MG PO CAPS
300.0000 mg | ORAL_CAPSULE | ORAL | Status: AC
  Administered 2019-09-08: 300 mg via ORAL
  Filled 2019-09-08: qty 1

## 2019-09-08 MED ORDER — MIDAZOLAM HCL 5 MG/5ML IJ SOLN
INTRAMUSCULAR | Status: DC | PRN
  Administered 2019-09-08: 2 mg via INTRAVENOUS

## 2019-09-08 MED ORDER — ENOXAPARIN (LOVENOX) PATIENT EDUCATION KIT
PACK | Freq: Once | Status: AC
  Filled 2019-09-08: qty 1

## 2019-09-08 MED ORDER — CHLORHEXIDINE GLUCONATE CLOTH 2 % EX PADS: 6.0000 | MEDICATED_PAD | Freq: Once | CUTANEOUS | Status: DC

## 2019-09-08 MED ORDER — KCL IN DEXTROSE-NACL 20-5-0.45 MEQ/L-%-% IV SOLN
INTRAVENOUS | Status: DC
  Filled 2019-09-08 (×6): qty 1000

## 2019-09-08 MED ORDER — LIDOCAINE HCL 2 % IJ SOLN
INTRAMUSCULAR | Status: AC
  Filled 2019-09-08: qty 20

## 2019-09-08 MED ORDER — GLYCOPYRROLATE PF 0.2 MG/ML IJ SOSY
PREFILLED_SYRINGE | INTRAMUSCULAR | Status: AC
  Filled 2019-09-08: qty 1

## 2019-09-08 MED ORDER — FENTANYL CITRATE (PF) 250 MCG/5ML IJ SOLN
INTRAMUSCULAR | Status: DC | PRN
  Administered 2019-09-08 (×3): 50 ug via INTRAVENOUS
  Administered 2019-09-08: 100 ug via INTRAVENOUS

## 2019-09-08 MED ORDER — LACTATED RINGERS IV SOLN: INTRAVENOUS | Status: DC

## 2019-09-08 MED ORDER — APREPITANT 40 MG PO CAPS
40.0000 mg | ORAL_CAPSULE | ORAL | Status: AC
  Administered 2019-09-08: 06:00:00 40 mg via ORAL
  Filled 2019-09-08: qty 1

## 2019-09-08 MED ORDER — PANTOPRAZOLE SODIUM 40 MG IV SOLR
40.0000 mg | Freq: Every day | INTRAVENOUS | Status: DC
  Administered 2019-09-08 – 2019-09-09 (×2): 40 mg via INTRAVENOUS
  Filled 2019-09-08 (×2): qty 40

## 2019-09-08 MED ORDER — EPHEDRINE 5 MG/ML INJ
INTRAVENOUS | Status: AC
  Filled 2019-09-08: qty 10

## 2019-09-08 MED ORDER — GLYCOPYRROLATE 0.2 MG/ML IJ SOLN
INTRAMUSCULAR | Status: DC | PRN
  Administered 2019-09-08: .2 mg via INTRAVENOUS

## 2019-09-08 MED ORDER — SODIUM CHLORIDE (PF) 0.9 % IJ SOLN
INTRAMUSCULAR | Status: DC | PRN
  Administered 2019-09-08: 10 mL

## 2019-09-08 MED ORDER — FENTANYL CITRATE (PF) 250 MCG/5ML IJ SOLN
INTRAMUSCULAR | Status: AC
  Filled 2019-09-08: qty 5

## 2019-09-08 MED ORDER — DEXAMETHASONE SODIUM PHOSPHATE 10 MG/ML IJ SOLN
INTRAMUSCULAR | Status: AC
  Filled 2019-09-08: qty 1

## 2019-09-08 MED ORDER — 0.9 % SODIUM CHLORIDE (POUR BTL) OPTIME
TOPICAL | Status: DC | PRN
  Administered 2019-09-08: 1000 mL

## 2019-09-08 MED ORDER — KETAMINE HCL 10 MG/ML IJ SOLN
INTRAMUSCULAR | Status: AC
  Filled 2019-09-08: qty 1

## 2019-09-08 MED ORDER — ONDANSETRON HCL 4 MG/2ML IJ SOLN
INTRAMUSCULAR | Status: DC | PRN
  Administered 2019-09-08: 4 mg via INTRAVENOUS

## 2019-09-08 MED ORDER — ROCURONIUM BROMIDE 100 MG/10ML IV SOLN
INTRAVENOUS | Status: DC | PRN
  Administered 2019-09-08: 10 mg via INTRAVENOUS
  Administered 2019-09-08: 30 mg via INTRAVENOUS
  Administered 2019-09-08: 20 mg via INTRAVENOUS
  Administered 2019-09-08: 30 mg via INTRAVENOUS
  Administered 2019-09-08: 10 mg via INTRAVENOUS
  Administered 2019-09-08: 70 mg via INTRAVENOUS
  Administered 2019-09-08: 30 mg via INTRAVENOUS
  Administered 2019-09-08: 20 mg via INTRAVENOUS
  Administered 2019-09-08: 30 mg via INTRAVENOUS

## 2019-09-08 MED ORDER — HEPARIN SODIUM (PORCINE) 5000 UNIT/ML IJ SOLN
5000.0000 [IU] | INTRAMUSCULAR | Status: AC
  Administered 2019-09-08: 5000 [IU] via SUBCUTANEOUS
  Filled 2019-09-08: qty 1

## 2019-09-08 MED ORDER — HEPARIN SODIUM (PORCINE) 5000 UNIT/ML IJ SOLN
5000.0000 [IU] | Freq: Three times a day (TID) | INTRAMUSCULAR | Status: DC
  Administered 2019-09-08 – 2019-09-10 (×5): 5000 [IU] via SUBCUTANEOUS
  Filled 2019-09-08 (×5): qty 1

## 2019-09-08 MED ORDER — BUPIVACAINE LIPOSOME 1.3 % IJ SUSP
INTRAMUSCULAR | Status: DC | PRN
  Administered 2019-09-08: 20 mL

## 2019-09-08 MED ORDER — MIDAZOLAM HCL 2 MG/2ML IJ SOLN
INTRAMUSCULAR | Status: AC
  Filled 2019-09-08: qty 2

## 2019-09-08 MED ORDER — SUGAMMADEX SODIUM 500 MG/5ML IV SOLN
INTRAVENOUS | Status: AC
  Filled 2019-09-08: qty 5

## 2019-09-08 MED ORDER — OXYCODONE HCL 5 MG/5ML PO SOLN
5.0000 mg | Freq: Four times a day (QID) | ORAL | Status: DC | PRN
  Administered 2019-09-08 – 2019-09-10 (×6): 5 mg via ORAL
  Filled 2019-09-08 (×6): qty 5

## 2019-09-08 MED ORDER — SODIUM CHLORIDE 0.9 % IV SOLN
2.0000 g | INTRAVENOUS | Status: AC
  Administered 2019-09-08: 2 g via INTRAVENOUS
  Filled 2019-09-08: qty 2

## 2019-09-08 MED ORDER — ACETAMINOPHEN 160 MG/5ML PO SOLN
1000.0000 mg | Freq: Three times a day (TID) | ORAL | Status: DC
  Administered 2019-09-08 – 2019-09-09 (×2): 1000 mg via ORAL
  Filled 2019-09-08 (×2): qty 40.6

## 2019-09-08 MED ORDER — LIDOCAINE 2% (20 MG/ML) 5 ML SYRINGE
INTRAMUSCULAR | Status: AC
  Filled 2019-09-08: qty 5

## 2019-09-08 MED ORDER — ACETAMINOPHEN 500 MG PO TABS
1000.0000 mg | ORAL_TABLET | Freq: Three times a day (TID) | ORAL | Status: DC
  Administered 2019-09-09 – 2019-09-10 (×3): 1000 mg via ORAL
  Filled 2019-09-08 (×3): qty 2

## 2019-09-08 MED ORDER — DEXAMETHASONE SODIUM PHOSPHATE 10 MG/ML IJ SOLN
INTRAMUSCULAR | Status: DC | PRN
  Administered 2019-09-08: 8 mg via INTRAVENOUS

## 2019-09-08 MED ORDER — SUGAMMADEX SODIUM 500 MG/5ML IV SOLN
INTRAVENOUS | Status: DC | PRN
  Administered 2019-09-08: 300 mg via INTRAVENOUS

## 2019-09-08 MED ORDER — SCOPOLAMINE 1 MG/3DAYS TD PT72
1.0000 | MEDICATED_PATCH | TRANSDERMAL | Status: DC
  Administered 2019-09-08: 1.5 mg via TRANSDERMAL
  Filled 2019-09-08: qty 1

## 2019-09-08 MED ORDER — ONDANSETRON HCL 4 MG/2ML IJ SOLN
INTRAMUSCULAR | Status: AC
  Filled 2019-09-08: qty 2

## 2019-09-08 MED ORDER — SODIUM CHLORIDE (PF) 0.9 % IJ SOLN
INTRAMUSCULAR | Status: AC
  Filled 2019-09-08: qty 10

## 2019-09-08 SURGICAL SUPPLY — 68 items
APPLICATOR COTTON TIP 6 STRL (MISCELLANEOUS) IMPLANT
APPLICATOR COTTON TIP 6IN STRL (MISCELLANEOUS)
APPLIER CLIP 5 13 M/L LIGAMAX5 (MISCELLANEOUS)
APPLIER CLIP ROT 10 11.4 M/L (STAPLE)
APPLIER CLIP ROT 13.4 12 LRG (CLIP)
BLADE SURG 15 STRL LF DISP TIS (BLADE) ×1 IMPLANT
BLADE SURG 15 STRL SS (BLADE) ×2
CABLE HIGH FREQUENCY MONO STRZ (ELECTRODE) IMPLANT
CLIP APPLIE 5 13 M/L LIGAMAX5 (MISCELLANEOUS) IMPLANT
CLIP APPLIE ROT 10 11.4 M/L (STAPLE) IMPLANT
CLIP APPLIE ROT 13.4 12 LRG (CLIP) IMPLANT
COVER WAND RF STERILE (DRAPES) IMPLANT
DERMABOND ADVANCED (GAUZE/BANDAGES/DRESSINGS) ×2
DERMABOND ADVANCED .7 DNX12 (GAUZE/BANDAGES/DRESSINGS) IMPLANT
DEVICE SUT QUICK LOAD TK 5 (STAPLE) IMPLANT
DEVICE SUT TI-KNOT TK 5X26 (MISCELLANEOUS) IMPLANT
DEVICE SUTURE ENDOST 10MM (ENDOMECHANICALS) IMPLANT
DEVICE TI KNOT TK5 (MISCELLANEOUS)
DISSECTOR BLUNT TIP ENDO 5MM (MISCELLANEOUS) IMPLANT
ELECT L-HOOK LAP 45CM DISP (ELECTROSURGICAL)
ELECT REM PT RETURN 15FT ADLT (MISCELLANEOUS) ×3 IMPLANT
ELECTRODE L-HOOK LAP 45CM DISP (ELECTROSURGICAL) IMPLANT
GAUZE SPONGE 4X4 12PLY STRL (GAUZE/BANDAGES/DRESSINGS) IMPLANT
GLOVE BIOGEL M 8.0 STRL (GLOVE) ×3 IMPLANT
GOWN STRL REUS W/TWL XL LVL3 (GOWN DISPOSABLE) ×12 IMPLANT
GRASPER SUT TROCAR 14GX15 (MISCELLANEOUS) ×3 IMPLANT
HANDLE STAPLE EGIA 4 XL (STAPLE) ×3 IMPLANT
HOVERMATT SINGLE USE (MISCELLANEOUS) ×3 IMPLANT
KIT BASIN OR (CUSTOM PROCEDURE TRAY) ×3 IMPLANT
KIT TURNOVER KIT A (KITS) IMPLANT
MARKER SKIN DUAL TIP RULER LAB (MISCELLANEOUS) IMPLANT
NDL SPNL 22GX3.5 QUINCKE BK (NEEDLE) ×1 IMPLANT
NEEDLE SPNL 22GX3.5 QUINCKE BK (NEEDLE) ×3 IMPLANT
PACK UNIVERSAL I (CUSTOM PROCEDURE TRAY) ×3 IMPLANT
PENCIL SMOKE EVACUATOR (MISCELLANEOUS) IMPLANT
QUICK LOAD TK 5 (STAPLE)
RELOAD STAPLE 45 PURP MED/THCK (STAPLE) IMPLANT
RELOAD TRI 45 ART MED THCK BLK (STAPLE) ×5 IMPLANT
RELOAD TRI 45 ART MED THCK PUR (STAPLE) IMPLANT
RELOAD TRI 60 ART MED THCK BLK (STAPLE) ×9 IMPLANT
RELOAD TRI 60 ART MED THCK PUR (STAPLE) IMPLANT
SCISSORS LAP 5X45 EPIX DISP (ENDOMECHANICALS) IMPLANT
SET IRRIG TUBING LAPAROSCOPIC (IRRIGATION / IRRIGATOR) ×3 IMPLANT
SET TUBE SMOKE EVAC HIGH FLOW (TUBING) ×3 IMPLANT
SHEARS HARMONIC ACE PLUS 45CM (MISCELLANEOUS) ×3 IMPLANT
SLEEVE ADV FIXATION 5X100MM (TROCAR) ×6 IMPLANT
SLEEVE GASTRECTOMY 36FR VISIGI (MISCELLANEOUS) ×3 IMPLANT
SOL ANTI FOG 6CC (MISCELLANEOUS) ×1 IMPLANT
SOLUTION ANTI FOG 6CC (MISCELLANEOUS) ×2
SPONGE LAP 18X18 RF (DISPOSABLE) ×3 IMPLANT
STAPLER VISISTAT 35W (STAPLE) ×3 IMPLANT
SUT MNCRL AB 4-0 PS2 18 (SUTURE) ×12 IMPLANT
SUT SURGIDAC NAB ES-9 0 48 120 (SUTURE) IMPLANT
SUT VIC AB 0 UR5 27 (SUTURE) ×2 IMPLANT
SUT VICRYL 0 TIES 12 18 (SUTURE) ×3 IMPLANT
SYR 10ML ECCENTRIC (SYRINGE) ×3 IMPLANT
SYR 20ML LL LF (SYRINGE) ×3 IMPLANT
SYR 50ML LL SCALE MARK (SYRINGE) ×3 IMPLANT
TOWEL OR 17X26 10 PK STRL BLUE (TOWEL DISPOSABLE) ×6 IMPLANT
TOWEL OR NON WOVEN STRL DISP B (DISPOSABLE) ×3 IMPLANT
TRAY FOLEY MTR SLVR 16FR STAT (SET/KITS/TRAYS/PACK) IMPLANT
TROCAR ADV FIXATION 5X100MM (TROCAR) ×3 IMPLANT
TROCAR BLADELESS 15MM (ENDOMECHANICALS) ×3 IMPLANT
TROCAR BLADELESS OPT 5 100 (ENDOMECHANICALS) ×3 IMPLANT
TUBE CALIBRATION LAPBAND (TUBING) IMPLANT
TUBING CONNECTING 10 (TUBING) ×2 IMPLANT
TUBING CONNECTING 10' (TUBING) ×1
TUBING ENDO SMARTCAP (MISCELLANEOUS) ×3 IMPLANT

## 2019-09-08 NOTE — Discharge Instructions (Signed)
° ° ° °GASTRIC BYPASS/SLEEVE ° Home Care Instructions ° ° These instructions are to help you care for yourself when you go home. ° °Call: If you have any problems. °• Call [phone] and ask for the surgeon on call °• If you need immediate help, come to the ER at [place].  °• Tell the ER staff that you are a new post-op gastric bypass or gastric sleeve patient °  °Signs and symptoms to report: • Severe vomiting or nausea °o If you cannot keep down clear liquids for longer than 1 day, call your surgeon  °• Abdominal pain that does not get better after taking your pain medication °• Fever over 100.4° F with chills °• Heart beating over 100 beats a minute °• Shortness of breath at rest °• Chest pain °•  Redness, swelling, drainage, or foul odor at incision (surgical) sites °•  If your incisions open or pull apart °• Swelling or pain in calf (lower leg) °• Diarrhea (Loose bowel movements that happen often), frequent watery, uncontrolled bowel movements °• Constipation, (no bowel movements for 3 days) if this happens: Pick one °o Milk of Magnesia, 2 tablespoons by mouth, 3 times a day for 2 days if needed °o Stop taking Milk of Magnesia once you have a bowel movement °o Call your doctor if constipation continues °Or °o Miralax  (instead of Milk of Magnesia) following the label instructions °o Stop taking Miralax once you have a bowel movement °o Call your doctor if constipation continues °• Anything you think is not normal °  °Normal side effects after surgery: • Unable to sleep at night or unable to focus °• Irritability or moody °• Being tearful (crying) or depressed °These are common complaints, possibly related to your anesthesia medications that put you to sleep, stress of surgery, and change in lifestyle.  This usually goes away a few weeks after surgery.  If these feelings continue, call your primary care doctor. °  °Wound Care: You may have surgical glue, steri-strips, or staples over your incisions after  surgery °• Surgical glue:  Looks like a clear film over your incisions and will wear off a little at a time °• Steri-strips: Strips of tape over your incisions. You may notice a yellowish color on the skin under the steri-strips. This is used to make the   steri-strips stick better. Do not pull the steri-strips off - let them fall off °• Staples: Staples may be removed before you leave the hospital °o If you go home with staples, call [facility], [phone] at for an appointment with your surgeon’s nurse to have staples removed 10 days after surgery. °• Showering: You may shower two (2) days after your surgery unless your surgeon tells you differently °o Wash gently around incisions with warm soapy water, rinse well, and gently pat dry  °o No tub baths until staples are removed, steri-strips fall off or glue is gone.  °  °Medications: • Medications should be liquid or crushed if larger than the size of a dime °• Extended release pills (medication that release a little bit at a time through the day) should NOT be crushed or cut. (examples include XL, ER, DR, SR) °• Depending on the size and number of medications you take, you may need to space (take a few throughout the day)/change the time you take your medications so that you do not over-fill your pouch (smaller stomach) °• Make sure you follow-up with your primary care doctor to   make medication changes needed during rapid weight loss and life-style changes °• If you have diabetes, follow up with the doctor that orders your diabetes medication(s) within one week after surgery and check your blood sugar regularly. °• Do not drive while taking prescription pain medication  °• It is ok to take Tylenol by the bottle instructions with your pain medicine or instead of your pain medicine as needed.  DO NOT TAKE NSAIDS (EXAMPLES OF NSAIDS:  IBUPROFREN/ NAPROXEN)  °Diet:                    First 2 Weeks ° You will see the dietician t about two (2) weeks  after your surgery. The dietician will increase the types of foods you can eat if you are handling liquids well: °• If you have severe vomiting or nausea and cannot keep down clear liquids lasting longer than 1 day, call your surgeon @ ([phone]) °Protein Shake °• Drink at least 2 ounces of shake 5-6 times per day °• Each serving of protein shakes (usually 8 - 12 ounces) should have: °o 15 grams of protein  °o And no more than 5 grams of carbohydrate  °• Goal for protein each day: °o Men = 80 grams per day °o Women = 60 grams per day °• Protein powder may be added to fluids such as non-fat milk or Lactaid milk or unsweetened Soy/Almond milk (limit to 35 grams added protein powder per serving) ° °Hydration °• Slowly increase the amount of water and other clear liquids as tolerated (See Acceptable Fluids) °• Slowly increase the amount of protein shake as tolerated  °•  Sip fluids slowly and throughout the day.  Do not use straws. °• May use sugar substitutes in small amounts (no more than 6 - 8 packets per day; i.e. Splenda) ° °Fluid Goal °• The first goal is to drink at least 8 ounces of protein shake/drink per day (or as directed by the nutritionist); some examples of protein shakes are Syntrax Nectar, Adkins Advantage, EAS Edge HP, and Unjury. See handout from pre-op Bariatric Education Class: °o Slowly increase the amount of protein shake you drink as tolerated °o You may find it easier to slowly sip shakes throughout the day °o It is important to get your proteins in first °• Your fluid goal is to drink 64 - 100 ounces of fluid daily °o It may take a few weeks to build up to this °• 32 oz (or more) should be clear liquids  °And  °• 32 oz (or more) should be full liquids (see below for examples) °• Liquids should not contain sugar, caffeine, or carbonation ° °Clear Liquids: °• Water or Sugar-free flavored water (i.e. Fruit H2O, Propel) °• Decaffeinated coffee or tea (sugar-free) °• Crystal Lite, Wyler’s Lite,  Minute Maid Lite °• Sugar-free Jell-O °• Bouillon or broth °• Sugar-free Popsicle:   *Less than 20 calories each; Limit 1 per day ° °Full Liquids: °Protein Shakes/Drinks + 2 choices per day of other full liquids °• Full liquids must be: °o No More Than 15 grams of Carbs per serving  °o No More Than 3 grams of Fat per serving °• Strained low-fat cream soup (except Cream of Potato or Tomato) °• Non-Fat milk °• Fat-free Lactaid Milk °• Unsweetened Soy Or Unsweetened Almond Milk °• Low Sugar yogurt (Dannon Lite & Fit, Greek yogurt; Oikos Triple Zero; Chobani Simply 100; Yoplait 100 calorie Greek - No Fruit on the Bottom) ° °  °Vitamins   and Minerals • Start 1 day after surgery unless otherwise directed by your surgeon °• 2 Chewable Bariatric Specific Multivitamin / Multimineral Supplement with iron (Example: Bariatric Advantage Multi EA) °• Chewable Calcium with Vitamin D-3 °(Example: 3 Chewable Calcium Plus 600 with Vitamin D-3) °o Take 500 mg three (3) times a day for a total of 1500 mg each day °o Do not take all 3 doses of calcium at one time as it may cause constipation, and you can only absorb 500 mg  at a time  °o Do not mix multivitamins containing iron with calcium supplements; take 2 hours apart °• Menstruating women and those with a history of anemia (a blood disease that causes weakness) may need extra iron °o Talk with your doctor to see if you need more iron °• Do not stop taking or change any vitamins or minerals until you talk to your dietitian or surgeon °• Your Dietitian and/or surgeon must approve all vitamin and mineral supplements °  °Activity and Exercise: Limit your physical activity as instructed by your doctor.  It is important to continue walking at home.  During this time, use these guidelines: °• Do not lift anything greater than ten (10) pounds for at least two (2) weeks °• Do not go back to work or drive until your surgeon says you can °• You may have sex when you feel comfortable  °o It is  VERY important for male patients to use a reliable birth control method; fertility often increases after surgery  °o All hormonal birth control will be ineffective for 30 days after surgery due to medications given during surgery a barrier method must be used. °o Do not get pregnant for at least 18 months °• Start exercising as soon as your doctor tells you that you can °o Make sure your doctor approves any physical activity °• Start with a simple walking program °• Walk 5-15 minutes each day, 7 days per week.  °• Slowly increase until you are walking 30-45 minutes per day °Consider joining our BELT program. [phone] or email [email or web address] °  °Special Instructions Things to remember: °• Use your CPAP when sleeping if this applies to you ° °• [facility] has two free Bariatric Surgery Support Groups that meet monthly °o The 3rd Thursday of each month, 6 pm, [facility]  °o The 2nd Friday of each month, 11:45 am in the private dining room in the basement of [facility] °• It is very important to keep all follow up appointments with your surgeon, dietitian, primary care physician, and behavioral health practitioner °• Routine follow up schedule with your surgeon include appointments at 2-3 weeks, 6-8 weeks, 6 months, and 1 year at a minimum.  Your surgeon may request to see you more often.   °o After the first year, please follow up with your bariatric surgeon and dietitian at least once a year in order to maintain best weight loss results °[facility]: [phone] °[facility]: [phone] °Bariatric Nurse Coordinator: [phone] °  °   Reviewed and Endorsed  °by  Patient Education Committee, June, 2016 °Edits Approved: Aug, 2018 ° ° ° °

## 2019-09-08 NOTE — Transfer of Care (Signed)
Immediate Anesthesia Transfer of Care Note  Patient: Kyle Richmond  Procedure(s) Performed: LAPAROSCOPIC GASTRIC BAND REMOVAL WITH LAPAROSCOPIC GASTRIC SLEEVE RESECTION, UPPER ENDO ERAS PATHWAY (N/A Abdomen)  Patient Location: PACU  Anesthesia Type:General  Level of Consciousness: drowsy, patient cooperative and responds to stimulation  Airway & Oxygen Therapy: Patient Spontanous Breathing and Patient connected to face mask oxygen  Post-op Assessment: Report given to RN and Post -op Vital signs reviewed and stable  Post vital signs: Reviewed and stable  Last Vitals:  Vitals Value Taken Time  BP 151/91 09/08/19 1147  Temp    Pulse 98 09/08/19 1148  Resp 11 09/08/19 1148  SpO2 100 % 09/08/19 1148  Vitals shown include unvalidated device data.  Last Pain:  Vitals:   09/08/19 0605  TempSrc:   PainSc: 0-No pain         Complications: No apparent anesthesia complications

## 2019-09-08 NOTE — Progress Notes (Signed)
Discussed post op day goals with patient including ambulation, IS, diet progression, pain, and nausea control.  BSTOP education provided including BSTOP information guide, "Guide for Pain Management after your Bariatric Procedure".  Questions answered. 

## 2019-09-08 NOTE — Interval H&P Note (Signed)
History and Physical Interval Note:  09/08/2019 7:04 AM  Kyle Richmond  has presented today for surgery, with the diagnosis of Adjustable Lap Band Status, Hypothyroidism, Lap Band Malfunction, OSA.  The various methods of treatment have been discussed with the patient and family. After consideration of risks, benefits and other options for treatment, the patient has consented to  Procedure(s): LAPAROSCOPIC GASTRIC BAND REMOVAL WITH LAPAROSCOPIC GASTRIC SLEEVE RESECTION, UPPER ENDO ERAS PATHWAY (N/A) as a surgical intervention.  The patient's history has been reviewed, patient examined, no change in status, stable for surgery.  I have reviewed the patient's chart and labs.  Questions were answered to the patient's satisfaction.     Valarie Merino

## 2019-09-08 NOTE — Progress Notes (Signed)
NUTRITION NOTE  Consult for bariatric diet teaching per DROP protocol received. Nurse Educator for the bariatric program to provide all post-op/pre-discharge teaching to include diet-related education.   Should additional nutrition-related needs arise, please re-consult.    Eural Holzschuh, MS, RD, LDN, CNSC Inpatient Clinical Dietitian RD pager # available in AMION  After hours/weekend pager # available in AMION  

## 2019-09-08 NOTE — Anesthesia Procedure Notes (Signed)
Procedure Name: Intubation Date/Time: 09/08/2019 7:39 AM Performed by: Thornell Mule, CRNA Pre-anesthesia Checklist: Patient identified, Emergency Drugs available, Suction available and Patient being monitored Patient Re-evaluated:Patient Re-evaluated prior to induction Oxygen Delivery Method: Circle system utilized Preoxygenation: Pre-oxygenation with 100% oxygen Induction Type: IV induction and Rapid sequence Ventilation: Mask ventilation without difficulty and Two handed mask ventilation required Laryngoscope Size: Glidescope and 4 Grade View: Grade I Tube type: Oral Tube size: 7.5 mm Number of attempts: 3 Airway Equipment and Method: Stylet,  Oral airway and Video-laryngoscopy Placement Confirmation: ETT inserted through vocal cords under direct vision,  positive ETCO2 and breath sounds checked- equal and bilateral Secured at: 22 cm Tube secured with: Tape Dental Injury: Teeth and Oropharynx as per pre-operative assessment  Difficulty Due To: Difficulty was unanticipated, Difficult Airway- due to large tongue and Difficult Airway- due to limited oral opening Future Recommendations: Recommend- induction with short-acting agent, and alternative techniques readily available Comments: Dl x 1 with Miller 3, unable to view glottic opening, DL x 1 with #4 glidescope glottic opening identified, oral intubation, + ETCO2, BBS=, cuff leak noted, DL with glidescope #4, tube changed out with good view of glottic opening, BBS=, + ETCO2.

## 2019-09-08 NOTE — Progress Notes (Signed)
PHARMACY CONSULT FOR:  Risk Assessment for Post-Discharge VTE Following Bariatric Surgery  Post-Discharge VTE Risk Assessment: This patient's probability of 30-day post-discharge VTE is increased due to the factors marked: x  Male    Age >/=60 years    BMI >/=50 kg/m2    CHF    Dyspnea at Rest    Paraplegia   x Non-gastric-band surgery   x Operation Time >/=3 hr    Return to OR     Length of Stay >/= 3 d      Hx of VTE   Hypercoagulable condition   Significant venous stasis   Predicted probability of 30-day post-discharge VTE: 0.48%  Recommendation for Discharge: Enoxaparin 40 mg [place] q12h x 2 weeks post-discharge   Kyle Richmond is a 44 y.o. male who underwent laparoscopic sleeve gastrectomy on 09/08/19.   Case start: 0822 Case end: 1137   No Known Allergies  Patient Measurements: Height: 5\' 9"  (175.3 cm) Weight: (!) 327 lb 9.6 oz (148.6 kg) IBW/kg (Calculated) : 70.7 Body mass index is 48.38 kg/m.  Recent Labs    09/08/19 0617  WBC 8.5  HGB 14.0  HCT 45.4  PLT 294  CREATININE 0.99  ALBUMIN 4.4  PROT 7.4  AST 21  ALT 25  ALKPHOS 68  BILITOT 0.6   Estimated Creatinine Clearance: 135.8 mL/min (by C-G formula based on SCr of 0.99 mg/dL).    Past Medical History:  Diagnosis Date  . Hypertension   . Obesity   . Sleep apnea    cpap     Medications Prior to Admission  Medication Sig Dispense Refill Last Dose  . levothyroxine (SYNTHROID, LEVOTHROID) 150 MCG tablet Take 150 mcg by mouth daily before breakfast.   0 09/08/2019 at 0430  . losartan (COZAAR) 50 MG tablet Take 50 mg by mouth daily.  1 09/07/2019 at Unknown time  . Multiple Vitamin (MULTIVITAMIN WITH MINERALS) TABS tablet Take 1 tablet by mouth daily.   09/07/2019 at Unknown time  . traMADol (ULTRAM) 50 MG tablet Take 50 mg by mouth every 6 (six) hours as needed.   Past Month at Unknown time  . fluticasone (FLONASE) 50 MCG/ACT nasal spray Place 2 sprays into both nostrils daily. (Patient not  taking: Reported on 08/18/2019) 16 g 2 Not Taking at Unknown time  . ibuprofen (ADVIL) 800 MG tablet Take 1 tablet (800 mg total) by mouth 3 (three) times daily. (Patient not taking: Reported on 08/18/2019) 21 tablet 0 Not Taking at Unknown time  . polyethylene glycol (MIRALAX) packet Take 17 g by mouth daily. Take one cup daily until you are having pudding consistency stools. (Patient not taking: Reported on 08/18/2019) 14 each 0 Not Taking at Unknown time    08/20/2019, PharmD 09/08/2019,2:07 PM

## 2019-09-08 NOTE — Op Note (Signed)
08 September 2019  Surgeon: Wenda Low, MD, FACS  Asst:  Phylliss Blakes, MD, FACS  Anes:  General endotracheal  Procedure: Enterolysis and removal of Lapband;  Laparoscopic sleeve gastrectomy and upper endoscopy  Diagnosis: Morbid obesity with failed Lapband  Complications: None noted  EBL:   30 cc  Description of Procedure:  The patient was take to OR 1 and given general anesthesia.  The abdomen was prepped with Technicare and draped sterilely.  A timeout was performed.  Access to the abdomen was achieved with a 5 mm Optiview through the left upper quadrant.  Following insufflation, the state of the abdomen was found to be loaded with adhesions with omentum fused to the anterior abdominal wall.  A two hour enterolysis was performed to find locations for trocars and to install these trocars.  The lapband and the tubing were encased with cicatrix and these were taken down.  The transverse colon was separated from the omentum creating a window to the stomach.  The band was encased with cicatrix and was incised with the Bovie.  It was unbuckled and removed.  The plication was taken down.  .  The ViSiGi 36Fr tube was inserted to deflate the stomach and was pulled back into the esophagus.    The pylorus was identified and we measured 5 cm back and marked the antrum.  At that point we began dissection to take down the greater curvature of the stomach using the Harmonic scalpel.  This dissection was taken all the way up to the left crus.  Posterior attachments of the stomach were also taken down.    The ViSiGi tube was then passed into the antrum and suction applied so that it was snug along the lessor curvature.  The "crow's foot" or incisura was identified.  The sleeve gastrectomy was begun using the [employer] stapler beginning with a 4.5 black with tRS and followed with 6 cm black loads with TRS.  When the sleeve was complete the tube was taken off suction and insufflated briefly.  The tube  was withdrawn.  Upper endoscopy was then performed by Dr. Fredricka Bonine.     The specimen was extracted through the 15 trocar site.  Local was provided by infiltrating with Exparel as a tap block and closed 4-0 Monocryl and Dermabond.    Matt B. Daphine Deutscher, MD, [facility], [place] [phone]

## 2019-09-08 NOTE — Op Note (Signed)
Preoperative diagnosis: failed lap band  Postoperative diagnosis: s/p removal of lap band and sleeve gastrectomy Procedure: Upper endoscopy   Surgeon: Berna Bue, M.D.  Anesthesia: Gen.   Description of procedure: The endoscopy was placed in the mouth and into the oropharynx and under endoscopic vision it was advanced to the esophagogastric junction which was identified at 43 from the teeth.  The pouch was tensely insufflated while the upper abdomen was flooded with irrigation to perform a leak test, which was negative. No bubbles were seen.  The staple line was hemostatic and the lumen was evenly tubular without undue narrowing, angulation or twisting specifically at the incisura angularis. The lumen was decompressed and the scope was withdrawn without difficulty.    Berna Bue, M.D. General, Bariatric, & Minimally Invasive Surgery [facility]

## 2019-09-08 NOTE — Anesthesia Postprocedure Evaluation (Signed)
Anesthesia Post Note  Patient: Kyle Richmond  Procedure(s) Performed: LAPAROSCOPIC GASTRIC BAND REMOVAL WITH LAPAROSCOPIC GASTRIC SLEEVE RESECTION, UPPER ENDO ERAS PATHWAY (N/A Abdomen)     Patient location during evaluation: PACU Anesthesia Type: General Level of consciousness: awake and alert Pain management: pain level controlled Vital Signs Assessment: post-procedure vital signs reviewed and stable Respiratory status: spontaneous breathing, nonlabored ventilation, respiratory function stable and patient connected to face mask oxygen Cardiovascular status: blood pressure returned to baseline and stable Postop Assessment: no apparent nausea or vomiting Anesthetic complications: no    Last Vitals:  Vitals:   09/08/19 1230 09/08/19 1245  BP: (!) 149/91 (!) 165/95  Pulse: 87 88  Resp: 10 11  Temp:    SpO2: 100% 100%    Last Pain:  Vitals:   09/08/19 1245  TempSrc:   PainSc: Asleep                 Karter Hellmer,W. EDMOND

## 2019-09-09 LAB — CBC WITH DIFFERENTIAL/PLATELET
Abs Immature Granulocytes: 0.05 10*3/uL (ref 0.00–0.07)
Basophils Absolute: 0 10*3/uL (ref 0.0–0.1)
Basophils Relative: 0 %
Eosinophils Absolute: 0 10*3/uL (ref 0.0–0.5)
Eosinophils Relative: 0 %
HCT: 39.4 % (ref 39.0–52.0)
Hemoglobin: 12.4 g/dL — ABNORMAL LOW (ref 13.0–17.0)
Immature Granulocytes: 0 %
Lymphocytes Relative: 9 %
Lymphs Abs: 1.2 10*3/uL (ref 0.7–4.0)
MCH: 23.4 pg — ABNORMAL LOW (ref 26.0–34.0)
MCHC: 31.5 g/dL (ref 30.0–36.0)
MCV: 74.2 fL — ABNORMAL LOW (ref 80.0–100.0)
Monocytes Absolute: 1.2 10*3/uL — ABNORMAL HIGH (ref 0.1–1.0)
Monocytes Relative: 8 %
Neutro Abs: 11.5 10*3/uL — ABNORMAL HIGH (ref 1.7–7.7)
Neutrophils Relative %: 83 %
Platelets: 261 10*3/uL (ref 150–400)
RBC: 5.31 MIL/uL (ref 4.22–5.81)
RDW: 16.6 % — ABNORMAL HIGH (ref 11.5–15.5)
WBC: 14 10*3/uL — ABNORMAL HIGH (ref 4.0–10.5)
nRBC: 0 % (ref 0.0–0.2)

## 2019-09-09 MED ORDER — LOSARTAN POTASSIUM 50 MG PO TABS
50.0000 mg | ORAL_TABLET | Freq: Every day | ORAL | Status: DC
  Administered 2019-09-09 – 2019-09-10 (×2): 50 mg via ORAL
  Filled 2019-09-09 (×2): qty 1

## 2019-09-09 NOTE — Progress Notes (Signed)
Patient ID: Kyle Richmond, male   DOB: [birth year], 44 y.o.   MRN: [id number] [facility] Progress Note:   1 Day Post-Op  Subjective: Mental status is clear Objective: Vital signs in last 24 hours: Temp:  [97.4 F (36.3 C)-98.7 F (37.1 C)] 98.1 F (36.7 C) (03/17 0531) Pulse Rate:  [78-98] 81 (03/17 0531) Resp:  [10-20] 18 (03/17 0531) BP: (130-181)/(70-100) 155/100 (03/17 0531) SpO2:  [95 %-100 %] 99 % (03/17 0531)  Intake/Output from previous day: 03/16 0701 - 03/17 0700 In: 4306.2 [P.O.:420; I.V.:3786.2; IV Piggyback:100] Out: 2600 [Urine:2575; Blood:25] Intake/Output this shift: No intake/output data recorded.  Physical Exam: Work of breathing is normal;  Minimal tenderness  Lab Results:  Results for orders placed or performed during the hospital encounter of 09/08/19 (from the past 48 hour(s))  CBC WITH DIFFERENTIAL     Status: Abnormal   Collection Time: 09/08/19  6:17 AM  Result Value Ref Range   WBC 8.5 4.0 - 10.5 K/uL   RBC 6.09 (H) 4.22 - 5.81 MIL/uL   Hemoglobin 14.0 13.0 - 17.0 g/dL   HCT 93.2 67.1 - 24.5 %   MCV 74.5 (L) 80.0 - 100.0 fL   MCH 23.0 (L) 26.0 - 34.0 pg   MCHC 30.8 30.0 - 36.0 g/dL   RDW 80.9 (H) 98.3 - 38.2 %   Platelets 294 150 - 400 K/uL   nRBC 0.0 0.0 - 0.2 %   Neutrophils Relative % 66 %   Neutro Abs 5.7 1.7 - 7.7 K/uL   Lymphocytes Relative 22 %   Lymphs Abs 1.8 0.7 - 4.0 K/uL   Monocytes Relative 7 %   Monocytes Absolute 0.6 0.1 - 1.0 K/uL   Eosinophils Relative 3 %   Eosinophils Absolute 0.2 0.0 - 0.5 K/uL   Basophils Relative 1 %   Basophils Absolute 0.1 0.0 - 0.1 K/uL   Immature Granulocytes 1 %   Abs Immature Granulocytes 0.04 0.00 - 0.07 K/uL    Comment: Performed at [facility], [address], [place], [place] [address]  Comprehensive metabolic panel     Status: None   Collection Time: 09/08/19  6:17 AM  Result Value Ref Range   Sodium 140 135 - 145 mmol/L   Potassium 3.7 3.5 - 5.1 mmol/L    Chloride 103 98 - 111 mmol/L   CO2 25 22 - 32 mmol/L   Glucose, Bld 98 70 - 99 mg/dL    Comment: Glucose reference range applies only to samples taken after fasting for at least 8 hours.   BUN 16 6 - 20 mg/dL   Creatinine, Ser 7.67 0.61 - 1.24 mg/dL   Calcium 9.4 8.9 - 34.1 mg/dL   Total Protein 7.4 6.5 - 8.1 g/dL   Albumin 4.4 3.5 - 5.0 g/dL   AST 21 15 - 41 U/L   ALT 25 0 - 44 U/L   Alkaline Phosphatase 68 38 - 126 U/L   Total Bilirubin 0.6 0.3 - 1.2 mg/dL   GFR calc non Af Amer >60 >60 mL/min   GFR calc Af Amer >60 >60 mL/min   Anion gap 12 5 - 15    Comment: Performed at [facility], [address] [place], [place] [address]  Type and screen     Status: None   Collection Time: 09/08/19  6:17 AM  Result Value Ref Range   ABO/RH(D) B POS    Antibody Screen NEG    Sample Expiration  09/11/2019,2359 Performed at [facility], [address], [place], [place] [address]   ABO/Rh     Status: None   Collection Time: 09/08/19  6:17 AM  Result Value Ref Range   ABO/RH(D)      B POS Performed at [facility], [address], [place], [place] [address]   CBC     Status: Abnormal   Collection Time: 09/08/19  2:30 PM  Result Value Ref Range   WBC 16.5 (H) 4.0 - 10.5 K/uL   RBC 5.79 4.22 - 5.81 MIL/uL   Hemoglobin 13.4 13.0 - 17.0 g/dL   HCT 28.7 68.1 - 15.7 %   MCV 75.3 (L) 80.0 - 100.0 fL   MCH 23.1 (L) 26.0 - 34.0 pg   MCHC 30.7 30.0 - 36.0 g/dL   RDW 26.2 (H) 03.5 - 59.7 %   Platelets 286 150 - 400 K/uL   nRBC 0.0 0.0 - 0.2 %    Comment: Performed at [facility], [address] [place], [place] [address]  Creatinine, serum     Status: None   Collection Time: 09/08/19  2:30 PM  Result Value Ref Range   Creatinine, Ser 1.08 0.61 - 1.24 mg/dL   GFR calc non Af Amer >60 >60 mL/min   GFR calc Af Amer >60 >60 mL/min    Comment: Performed at [facility], [address], [place], [place] [address]  Hemoglobin and hematocrit, blood     Status: None   Collection Time: 09/08/19  4:31 PM  Result Value Ref Range   Hemoglobin 13.5 13.0 - 17.0 g/dL   HCT 68.0 32.1 - 22.4 %    Comment: Performed at [facility], [address], [place], [place] [address]  CBC WITH DIFFERENTIAL     Status: Abnormal   Collection Time: 09/09/19  4:29 AM  Result Value Ref Range   WBC 14.0 (H) 4.0 - 10.5 K/uL   RBC 5.31 4.22 - 5.81 MIL/uL   Hemoglobin 12.4 (L) 13.0 - 17.0 g/dL   HCT 37.0 48.8 - 89.1 %   MCV 74.2 (L) 80.0 - 100.0 fL   MCH 23.4 (L) 26.0 - 34.0 pg   MCHC 31.5 30.0 - 36.0 g/dL   RDW 69.4 (H) 50.3 - 88.8 %   Platelets 261 150 - 400 K/uL   nRBC 0.0 0.0 - 0.2 %   Neutrophils Relative % 83 %   Neutro Abs 11.5 (H) 1.7 - 7.7 K/uL   Lymphocytes Relative 9 %   Lymphs Abs 1.2 0.7 - 4.0 K/uL   Monocytes Relative 8 %   Monocytes Absolute 1.2 (H) 0.1 - 1.0 K/uL   Eosinophils Relative 0 %   Eosinophils Absolute 0.0 0.0 - 0.5 K/uL   Basophils Relative 0 %   Basophils Absolute 0.0 0.0 - 0.1 K/uL   Immature Granulocytes 0 %   Abs Immature Granulocytes 0.05 0.00 - 0.07 K/uL    Comment: Performed at [facility], [address] [place], [place] [address]    Radiology/Results: No results found.  Anti-infectives: Anti-infectives (From admission, onward)   Start     Dose/Rate Route Frequency Ordered Stop   09/08/19 0600  cefoTEtan (CEFOTAN) 2 g in sodium chloride 0.9 % 100 mL IVPB     2 g 200 mL/hr over 30 Minutes Intravenous On call to O.R. 09/08/19 0534 09/08/19 0823      Assessment/Plan: Problem List: Patient Active Problem List  Diagnosis Date Noted  . 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  Not ready for discharge today 1 Day Post-Op    LOS: 1 day   Matt B. Hassell Done, MD, [facility] [phone] beeper [phone]  09/09/2019  7:28 AM

## 2019-09-09 NOTE — Progress Notes (Signed)
Patient alert and oriented, Post op day 1.  Provided support and encouragement.  Encouraged pulmonary toilet, ambulation and small sips of liquids.  Completed 12 ounces of bari clear fluids and 2 ounces protein.  All questions answered.  Will continue to monitor.

## 2019-09-10 LAB — CBC WITH DIFFERENTIAL/PLATELET
Abs Immature Granulocytes: 0.05 10*3/uL (ref 0.00–0.07)
Basophils Absolute: 0 10*3/uL (ref 0.0–0.1)
Basophils Relative: 0 %
Eosinophils Absolute: 0 10*3/uL (ref 0.0–0.5)
Eosinophils Relative: 0 %
HCT: 38.2 % — ABNORMAL LOW (ref 39.0–52.0)
Hemoglobin: 11.9 g/dL — ABNORMAL LOW (ref 13.0–17.0)
Immature Granulocytes: 0 %
Lymphocytes Relative: 13 %
Lymphs Abs: 1.5 10*3/uL (ref 0.7–4.0)
MCH: 23 pg — ABNORMAL LOW (ref 26.0–34.0)
MCHC: 31.2 g/dL (ref 30.0–36.0)
MCV: 73.7 fL — ABNORMAL LOW (ref 80.0–100.0)
Monocytes Absolute: 0.8 10*3/uL (ref 0.1–1.0)
Monocytes Relative: 7 %
Neutro Abs: 9.3 10*3/uL — ABNORMAL HIGH (ref 1.7–7.7)
Neutrophils Relative %: 80 %
Platelets: 258 10*3/uL (ref 150–400)
RBC: 5.18 MIL/uL (ref 4.22–5.81)
RDW: 16.6 % — ABNORMAL HIGH (ref 11.5–15.5)
WBC: 11.8 10*3/uL — ABNORMAL HIGH (ref 4.0–10.5)
nRBC: 0 % (ref 0.0–0.2)

## 2019-09-10 LAB — SURGICAL PATHOLOGY

## 2019-09-10 MED ORDER — ENOXAPARIN SODIUM 40 MG/0.4ML ~~LOC~~ SOLN: 40.0000 mg | Freq: Two times a day (BID) | SUBCUTANEOUS | 0 refills | Status: DC

## 2019-09-10 MED ORDER — ENOXAPARIN SODIUM 40 MG/0.4ML ~~LOC~~ SOLN: 1.0000 mg/kg | Freq: Two times a day (BID) | SUBCUTANEOUS | 0 refills | Status: DC

## 2019-09-10 MED ORDER — ONDANSETRON 4 MG PO TBDP: 4.0000 mg | ORAL_TABLET | Freq: Four times a day (QID) | ORAL | 0 refills | Status: DC | PRN

## 2019-09-10 MED ORDER — OXYCODONE HCL 5 MG PO TABS: 5.0000 mg | ORAL_TABLET | Freq: Four times a day (QID) | ORAL | 0 refills | Status: DC | PRN

## 2019-09-10 MED ORDER — PANTOPRAZOLE SODIUM 40 MG PO TBEC: 40.0000 mg | DELAYED_RELEASE_TABLET | Freq: Every day | ORAL | 0 refills | Status: DC

## 2019-09-10 NOTE — Discharge Summary (Addendum)
Physician Discharge Summary  Patient ID: Kyle Richmond MRN: [id number] DOB/AGE: 25-Apr-1976 44 y.o.  PCP: Renford Dills, MD  Admit date: 09/08/2019 Discharge date: 09/10/2019  Admission Diagnoses:  Morbid obesity with lapband in place that is malfunctioning  Discharge Diagnoses:  Post removal of lapband and conversion to sleeve gastrectomy  Active Problems:   S/P laparoscopic sleeve gastrectomy   Surgery:  Enterolysis, removal of lapband and conversion to sleeve gastrectomy  Discharged Condition: stable and improved  Hospital Course:   Had surgery, did well and was ready for discharge on PD 2  Consults: pharmacy  Significant Diagnostic Studies: none    Discharge Exam: Blood pressure (!) 140/94, pulse 73, temperature 99 F (37.2 C), temperature source Oral, resp. rate 16, height 5\' 9"  (1.753 m), weight (!) 148.6 kg, SpO2 100 %. Incisions OK  Disposition: Discharge disposition: 01-Home or Self Care       Discharge Instructions    Ambulate hourly while awake   Complete by: As directed    Call MD for:  difficulty breathing, headache or visual disturbances   Complete by: As directed    Call MD for:  persistant dizziness or light-headedness   Complete by: As directed    Call MD for:  persistant nausea and vomiting   Complete by: As directed    Call MD for:  redness, tenderness, or signs of infection (pain, swelling, redness, odor or green/yellow discharge around incision site)   Complete by: As directed    Call MD for:  severe uncontrolled pain   Complete by: As directed    Call MD for:  temperature >101 F   Complete by: As directed    Diet bariatric full liquid   Complete by: As directed    Incentive spirometry   Complete by: As directed    Perform hourly while awake     Allergies as of 09/10/2019   No Known Allergies     Medication List    STOP taking these medications   ibuprofen 800 MG tablet Commonly known as: ADVIL   polyethylene glycol 17 g  packet Commonly known as: MiraLax   traMADol 50 MG tablet Commonly known as: ULTRAM     TAKE these medications   enoxaparin 40 MG/0.4ML injection Commonly known as: LOVENOX Inject 0.4 mLs (40 mg total) into the skin 2 (two) times daily for 14 days.   fluticasone 50 MCG/ACT nasal spray Commonly known as: FLONASE Place 2 sprays into both nostrils daily.   levothyroxine 150 MCG tablet Commonly known as: SYNTHROID Take 150 mcg by mouth daily before breakfast.   losartan 50 MG tablet Commonly known as: COZAAR Take 50 mg by mouth daily. Notes to patient: Monitor Blood Pressure Daily and keep a log for primary care physician.  You may need to make changes to your medications with rapid weight loss.     multivitamin with minerals Tabs tablet Take 1 tablet by mouth daily.   ondansetron 4 MG disintegrating tablet Commonly known as: ZOFRAN-ODT Take 1 tablet (4 mg total) by mouth every 6 (six) hours as needed for nausea or vomiting.   oxyCODONE 5 MG immediate release tablet Commonly known as: Oxy IR/ROXICODONE Take 1 tablet (5 mg total) by mouth every 6 (six) hours as needed for severe pain.   pantoprazole 40 MG tablet Commonly known as: PROTONIX Take 1 tablet (40 mg total) by mouth daily.      Follow-up Information    [facility] 09/12/2019. Go on 10/01/2019.   Specialty:  General Surgery Why: at 10 am with Dr Johnathan Hausen Contact information: [place] [address] [phone]        Carlena Hurl, PA-C. Go on 10/27/2019.   Specialty: General Surgery Why: at 2 pm Contact information: [place] [address] [phone]           Signed: Pedro Earls 09/10/2019, 8:28 AM

## 2019-09-10 NOTE — Progress Notes (Signed)
Patient alert and oriented, pain is controlled. Patient is tolerating fluids, advanced to protein shake today, patient is tolerating well.  Reviewed Gastric sleeve discharge instructions with patient and patient is able to articulate understanding.  Provided information on BELT program, Support Group and [facility] outpatient pharmacy. All questions answered, will continue to monitor.   Total fluid intake 600 Call back Monday 3/22

## 2019-09-10 NOTE — Progress Notes (Signed)
Discharge instructions discussed with patient and family, verbalized agreement and understanding 

## 2019-09-14 ENCOUNTER — Telehealth (HOSPITAL_COMMUNITY): Payer: Self-pay

## 2019-09-14 NOTE — Telephone Encounter (Signed)
Patient called to discuss post bariatric surgery follow up questions.  See below:   1.  Tell me about your pain and pain management?denies  2.  Let's talk about fluid intake.  How much total fluid are you taking in?  45 ounces (11 ounces protein shake, 24 ounces water, 2 ounces popsicle, 8 ounces soup)   3.  How much protein have you taken in the last 2 days? 30 grams, discussed purchases unflavored protein to add to soups or drinking a shake in the place of soup until fluid intake increases.  4.  Have you had nausea?  Tell me about when have experienced nausea and what you did to help?denies  5.  Has the frequency or color changed with your urine?urine clear light  6.  Tell me what your incisions look like?incision look good, removed tegaderm over one incision.  7.  Have you been passing gas? BM? small bm, miralax daily recommended  8.  If a problem or question were to arise who would you call?  Do you know contact numbers for [facility], [facility], and [facility]?aware of how to contact all services  9.  How has the walking going?walking regularly  10.  How are your vitamins and calcium going?  How are you taking them?taking mvi and calcium no problems  Taking Lovenox injections wife giving to patient

## 2019-09-22 ENCOUNTER — Encounter: Payer: Commercial Managed Care - PPO | Admitting: Skilled Nursing Facility1

## 2019-09-22 ENCOUNTER — Other Ambulatory Visit: Payer: Self-pay

## 2019-09-22 DIAGNOSIS — E669 Obesity, unspecified: Secondary | ICD-10-CM

## 2019-09-23 NOTE — Progress Notes (Signed)
2 Week Post-Operative Nutrition Class   Patient was seen on 08/19/18 for Post-Operative Nutrition education at the [facility].    Surgery date: 09/08/2019 Surgery type: sleeve Start weight at [facility]: 338.2 Weight today: 311.9   Body Composition Scale Declined  Total Body Fat %   Visceral Fat   Fat-Free Mass %    Total Body Water %    Muscle-Mass lbs   Body Fat Displacement          Torso  lbs          Left Leg  lbs          Right Leg  lbs          Left Arm  lbs          Right Arm   lbs      The following the learning objectives were met by the patient during this course:  Identifies Phase 3 (Soft, High Proteins) Dietary Goals and will begin from 2 weeks post-operatively to 2 months post-operatively  Identifies appropriate sources of fluids and proteins   States protein recommendations and appropriate sources post-operatively  Identifies the need for appropriate texture modifications, mastication, and bite sizes when consuming solids  Identifies appropriate multivitamin and calcium sources post-operatively  Describes the need for physical activity post-operatively and will follow MD recommendations  States when to call healthcare provider regarding medication questions or post-operative complications   Handouts given during class include:  Phase 3A: Soft, High Protein Diet Handout   Follow-Up Plan: Patient will follow-up at [facility] in 6 weeks for 2 month post-op nutrition visit for diet advancement per MD.

## 2019-09-28 ENCOUNTER — Telehealth: Payer: Self-pay | Admitting: Skilled Nursing Facility1

## 2019-09-28 NOTE — Telephone Encounter (Signed)
LVM

## 2019-11-02 ENCOUNTER — Other Ambulatory Visit: Payer: Self-pay

## 2019-11-02 ENCOUNTER — Encounter: Payer: Self-pay | Admitting: Dietician

## 2019-11-02 ENCOUNTER — Encounter: Payer: 59 | Attending: Surgery | Admitting: Dietician

## 2019-11-02 DIAGNOSIS — Z9884 Bariatric surgery status: Secondary | ICD-10-CM

## 2019-11-02 DIAGNOSIS — E669 Obesity, unspecified: Secondary | ICD-10-CM | POA: Diagnosis present

## 2019-11-02 NOTE — Progress Notes (Signed)
Bariatric Nutrition Follow-Up Visit Medical Nutrition Therapy  Appt Start Time: 4:30pm    End Time: 5:00pm  2 Months Post-Operative Sleeve Surgery Surgery Date: 09/08/2019  Pt's Expectations of Surgery/ Goals: to lose weight Pt Reported Successes: clothes fitting more loosely     NUTRITION ASSESSMENT  Anthropometrics  Start weight at [facility]: 338.2 lbs (date: 08/24/2019) Today's weight: pt declined    Lifestyle & Dietary Hx Patient states he is having a hard time meeting both protein and fluid goals. States he typically only eats twice per day- breakfast in the morning then when he gets home from work. Avoids pork and beef. May have chicken, ground [place], eggs, protein shake, or greek yogurt. May snack on sugar free popsicles and jello. States protein water leave a bad after taste but he is trying new brands and working on sipping fluids more throughout the day.   24-Hr Dietary Recall First Meal: eggs + chicken sausage  Snack: -  Second Meal: protein shake  Snack: -  Third Meal: chicken  Snack: - Beverages: sugar free [place], water, Powerade Zero   Estimated daily fluid intake: 50 oz Estimated daily protein intake: ~45 g Supplements: bariatric MVI  Current average weekly physical activity: walking, stationary bike    Post-Op Goals/ Signs/ Symptoms Using straws: no Drinking while eating: no Chewing/swallowing difficulties: no Changes in vision: no Changes to mood/headaches: no Hair loss/changes to skin/nails: no Difficulty focusing/concentrating: no Sweating: no Dizziness/lightheadedness: no Palpitations: no  Carbonated/caffeinated beverages: no N/V/D/C/Gas: constipation  Abdominal pain: no Dumping syndrome: no   NUTRITION DIAGNOSIS  Overweight/obesity (-3.3) related to past poor dietary habits and physical inactivity as evidenced by completed bariatric surgery and following dietary guidelines for continued weight loss and healthy nutrition status.   NUTRITION  INTERVENTION Nutrition counseling (C-1) and education (E-2) to facilitate bariatric surgery goals, including: . Diet advancement to the next phase (phase 4) now including non-starchy vegetables  . The importance of consuming adequate calories as well as certain nutrients daily due to the body's need for essential vitamins, minerals, and fats . The importance of daily physical activity and to reach a goal of at least 150 minutes of moderate to vigorous physical activity weekly (or as directed by their physician) due to benefits such as increased musculature and improved lab values  Handouts Provided Include   Phase 4: Protein + Non-Starchy Vegetables   Learning Style & Readiness for Change Teaching method utilized: Visual & Auditory  Demonstrated degree of understanding via: Teach Back  Barriers to learning/adherence to lifestyle change: None Identified    MONITORING & EVALUATION Dietary intake, weekly physical activity, body weight, and goals in 4 months.  Next Steps Patient is to follow-up in 4 months for 6 month post-op follow-up.

## 2019-11-02 NOTE — Patient Instructions (Signed)

## 2020-02-13 IMAGING — CR DG ABDOMEN 2V
3 series · 3 of 3 positions shown · non-contrast
Comparison: 01/15/2018

CLINICAL DATA: Abdominal discomfort on the left for 2 weeks

EXAM:
ABDOMEN - 2 VIEW

[w abdomen upright *]
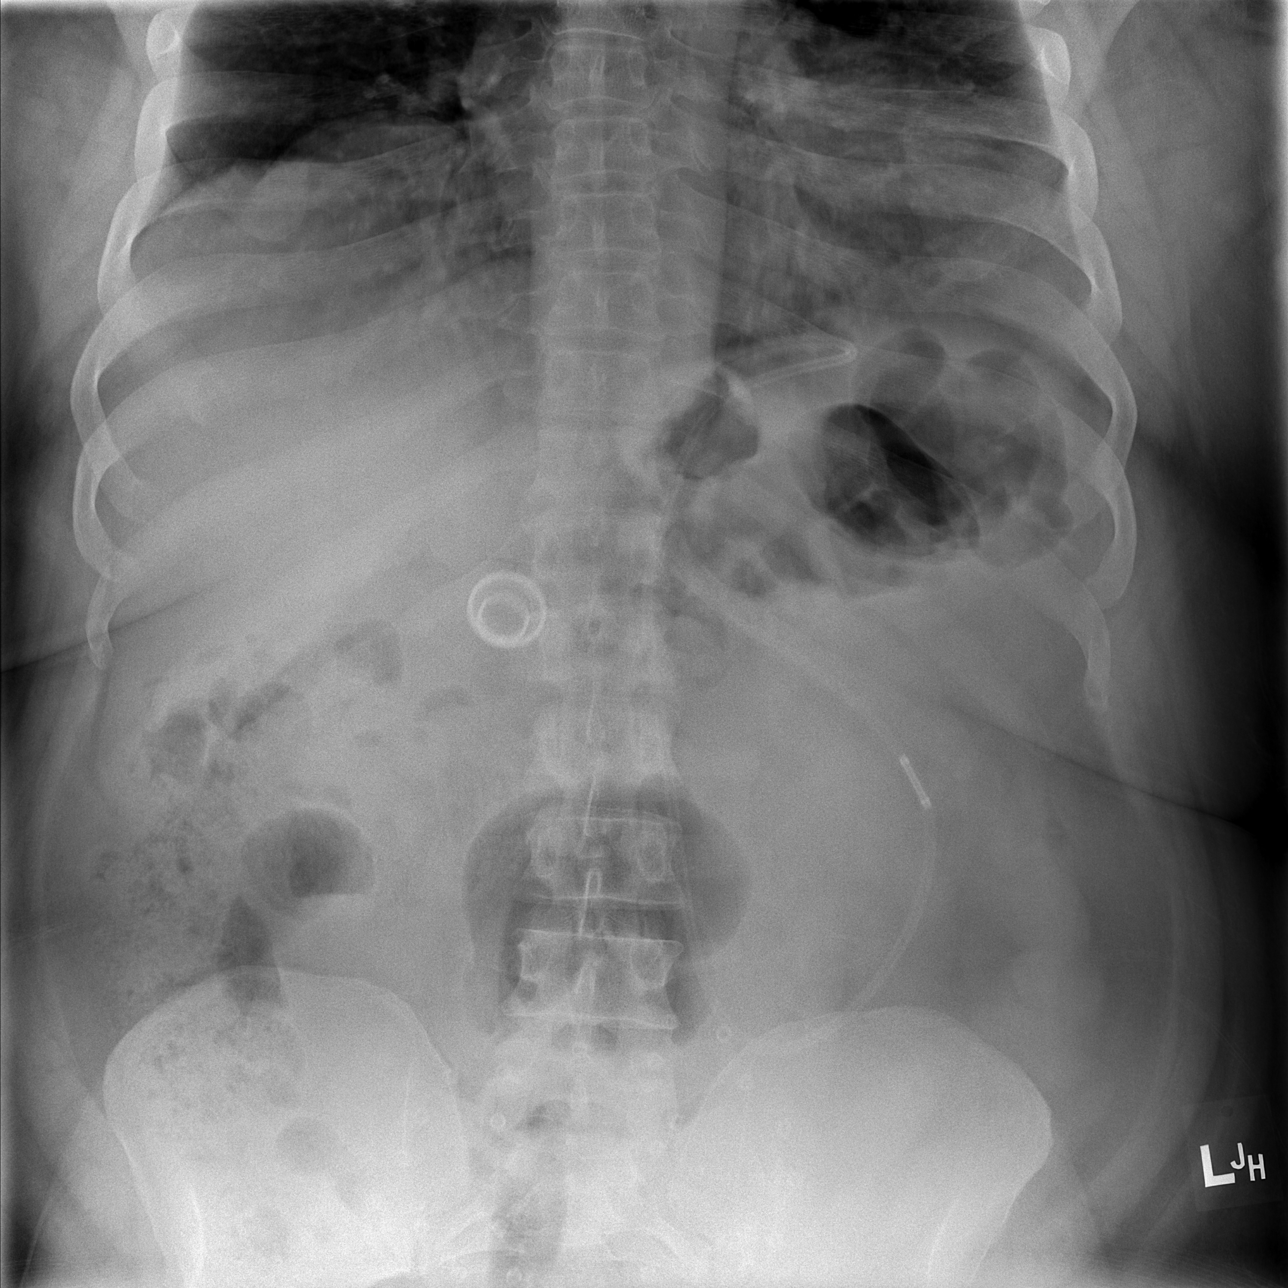

[t abdomen supine *]
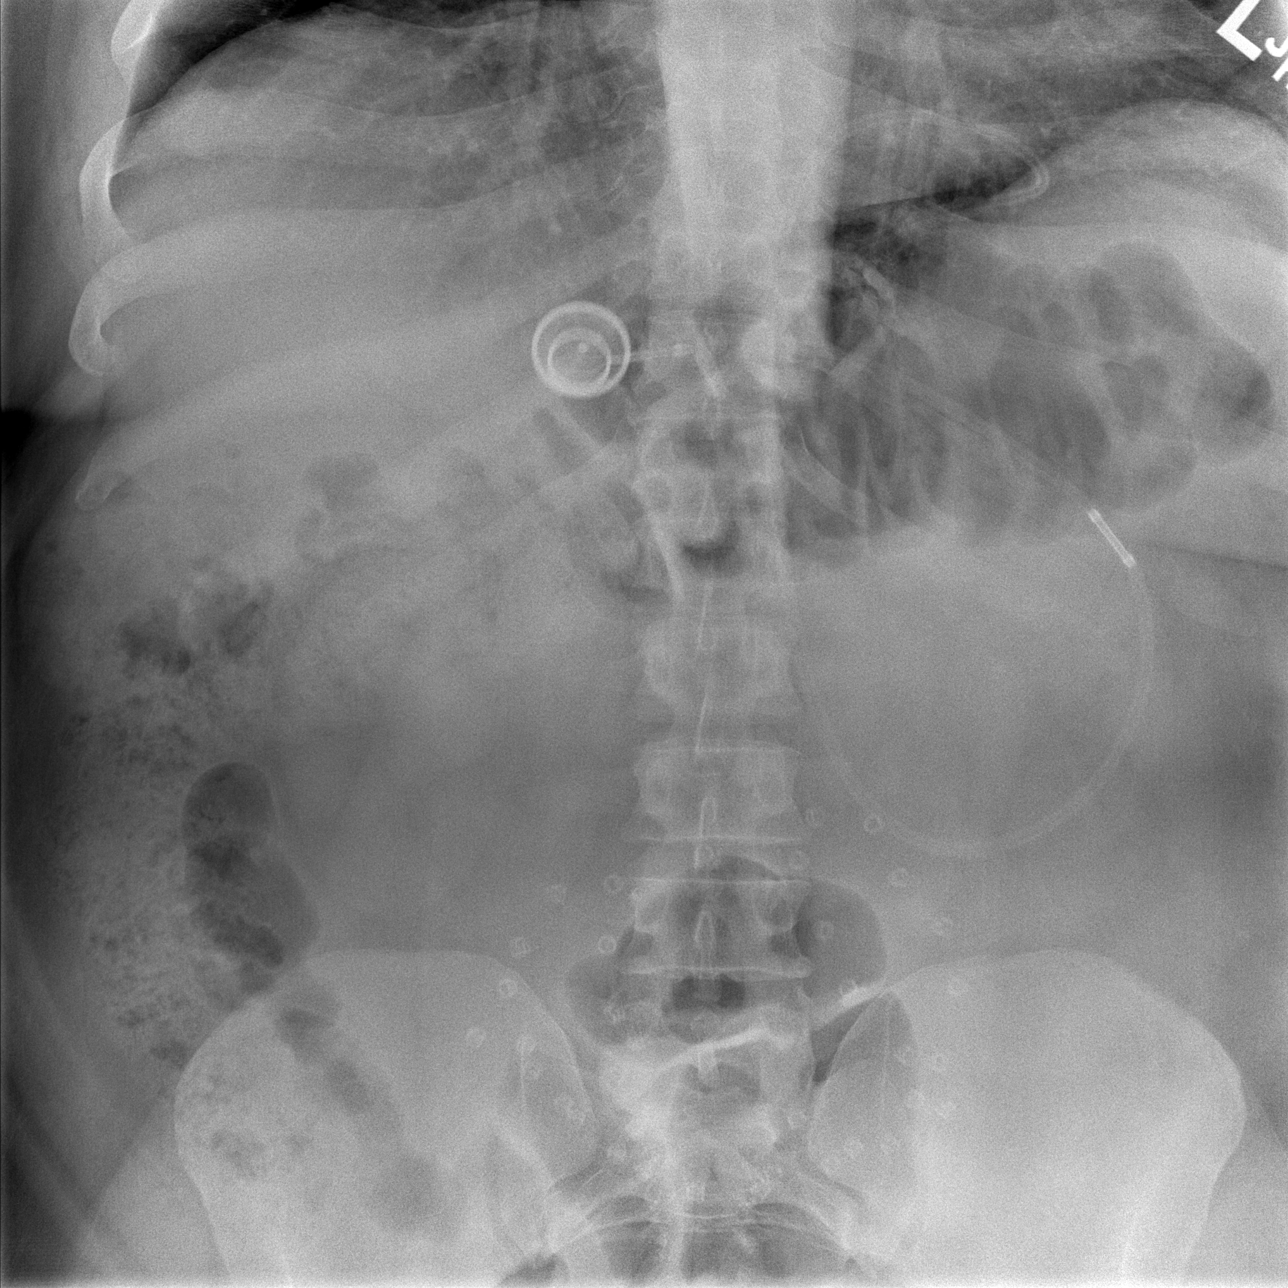

[t abdomen supine]
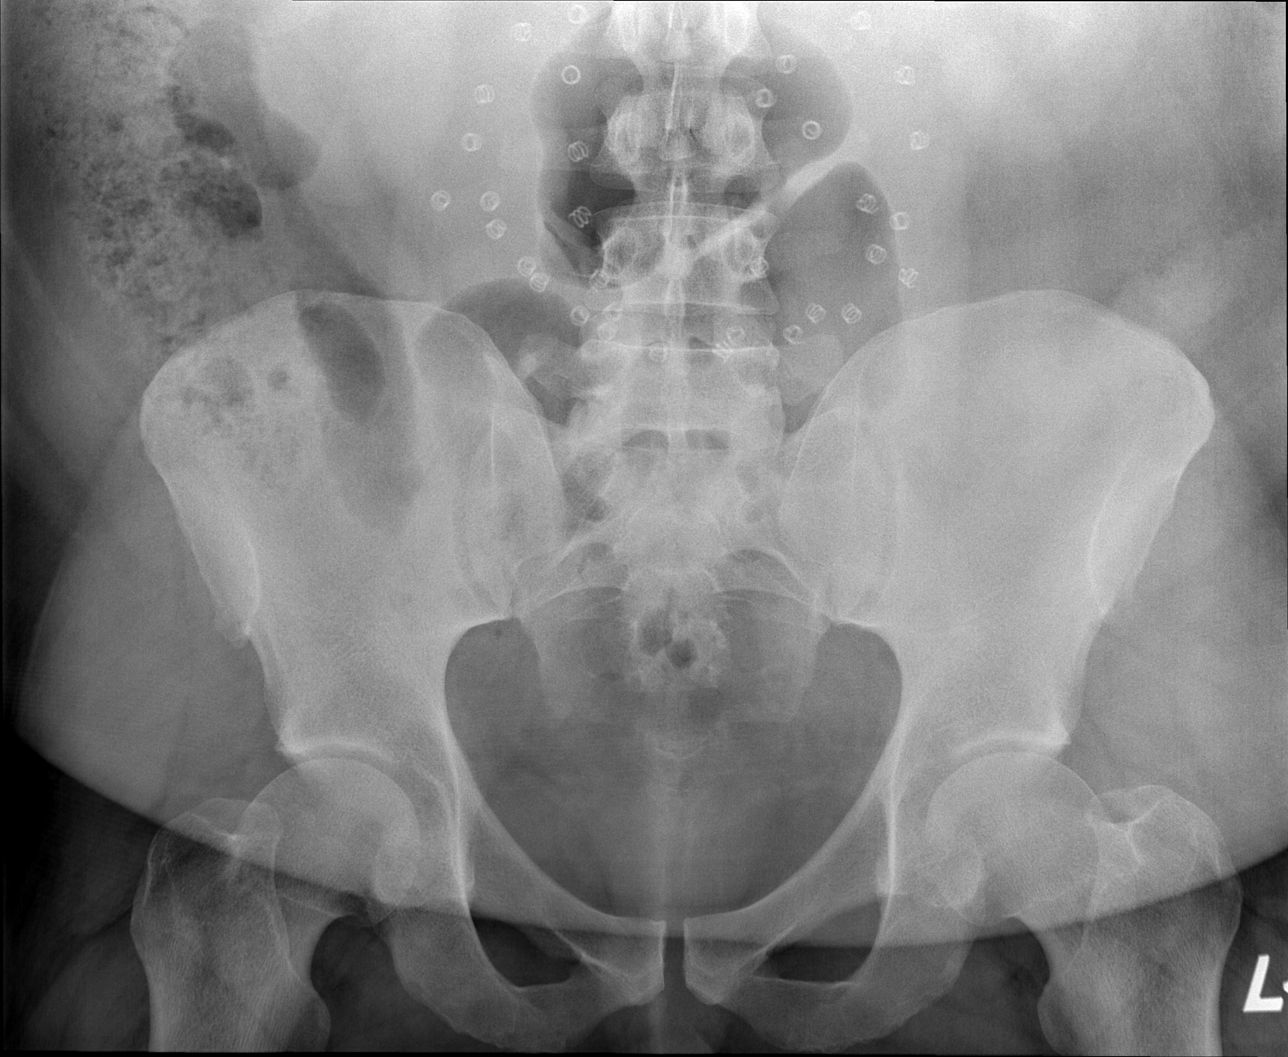

[3 of 3 positions shown; findings below may reference images not displayed]

FINDINGS: Lap band in normal position. Stool in the ascending colon and gas in
the transverse and sigmoid colon. No obstructive pattern or rectal
distention. Changes of ventral hernia repair. Lung bases are clear.
No evidence of stone or abnormal mass effect.
IMPRESSION: Benign abdominal radiograph. The lap band remains in expected
position.

## 2020-03-08 ENCOUNTER — Ambulatory Visit: Payer: Commercial Managed Care - PPO

## 2022-03-23 ENCOUNTER — Encounter (HOSPITAL_COMMUNITY): Payer: Self-pay | Admitting: *Deleted

## 2022-03-31 ENCOUNTER — Encounter (HOSPITAL_COMMUNITY): Payer: Self-pay | Admitting: Emergency Medicine

## 2022-03-31 ENCOUNTER — Other Ambulatory Visit: Payer: Self-pay

## 2022-03-31 ENCOUNTER — Emergency Department (HOSPITAL_COMMUNITY)
Admission: EM | Admit: 2022-03-31 | Discharge: 2022-03-31 | Disposition: A | Discharge status: Home / Self Care | Payer: 59 | Attending: Emergency Medicine | Admitting: Emergency Medicine

## 2022-03-31 ENCOUNTER — Emergency Department (HOSPITAL_COMMUNITY): Payer: 59

## 2022-03-31 ENCOUNTER — Telehealth (HOSPITAL_COMMUNITY): Payer: Self-pay | Admitting: Emergency Medicine

## 2022-03-31 DIAGNOSIS — N201 Calculus of ureter: Secondary | ICD-10-CM

## 2022-03-31 DIAGNOSIS — N132 Hydronephrosis with renal and ureteral calculous obstruction: Secondary | ICD-10-CM | POA: Diagnosis not present

## 2022-03-31 DIAGNOSIS — Z79899 Other long term (current) drug therapy: Secondary | ICD-10-CM | POA: Insufficient documentation

## 2022-03-31 DIAGNOSIS — R109 Unspecified abdominal pain: Secondary | ICD-10-CM | POA: Diagnosis present

## 2022-03-31 DIAGNOSIS — I10 Essential (primary) hypertension: Secondary | ICD-10-CM | POA: Insufficient documentation

## 2022-03-31 LAB — URINALYSIS, ROUTINE W REFLEX MICROSCOPIC
Bacteria, UA: NONE SEEN
Bilirubin Urine: NEGATIVE
Glucose, UA: NEGATIVE mg/dL
Ketones, ur: NEGATIVE mg/dL
Leukocytes,Ua: NEGATIVE
Nitrite: NEGATIVE
Protein, ur: 30 mg/dL — AB
Specific Gravity, Urine: 1.027 (ref 1.005–1.030)
pH: 5 (ref 5.0–8.0)

## 2022-03-31 LAB — CBC WITH DIFFERENTIAL/PLATELET
Abs Immature Granulocytes: 0.02 10*3/uL (ref 0.00–0.07)
Basophils Absolute: 0 10*3/uL (ref 0.0–0.1)
Basophils Relative: 1 %
Eosinophils Absolute: 0.2 10*3/uL (ref 0.0–0.5)
Eosinophils Relative: 2 %
HCT: 39.9 % (ref 39.0–52.0)
Hemoglobin: 12.9 g/dL — ABNORMAL LOW (ref 13.0–17.0)
Immature Granulocytes: 0 %
Lymphocytes Relative: 26 %
Lymphs Abs: 1.8 10*3/uL (ref 0.7–4.0)
MCH: 23.8 pg — ABNORMAL LOW (ref 26.0–34.0)
MCHC: 32.3 g/dL (ref 30.0–36.0)
MCV: 73.8 fL — ABNORMAL LOW (ref 80.0–100.0)
Monocytes Absolute: 0.5 10*3/uL (ref 0.1–1.0)
Monocytes Relative: 8 %
Neutro Abs: 4.3 10*3/uL (ref 1.7–7.7)
Neutrophils Relative %: 63 %
Platelets: 284 10*3/uL (ref 150–400)
RBC: 5.41 MIL/uL (ref 4.22–5.81)
RDW: 16.2 % — ABNORMAL HIGH (ref 11.5–15.5)
WBC: 6.8 10*3/uL (ref 4.0–10.5)
nRBC: 0 % (ref 0.0–0.2)

## 2022-03-31 LAB — BASIC METABOLIC PANEL
Anion gap: 11 (ref 5–15)
BUN: 12 mg/dL (ref 6–20)
CO2: 22 mmol/L (ref 22–32)
Calcium: 9 mg/dL (ref 8.9–10.3)
Chloride: 108 mmol/L (ref 98–111)
Creatinine, Ser: 0.94 mg/dL (ref 0.61–1.24)
GFR, Estimated: >60 mL/min (ref >60)
Glucose, Bld: 113 mg/dL — ABNORMAL HIGH (ref 70–99)
Potassium: 4.1 mmol/L (ref 3.5–5.1)
Sodium: 141 mmol/L (ref 135–145)

## 2022-03-31 MED ORDER — KETOROLAC TROMETHAMINE 15 MG/ML IJ SOLN
15.0000 mg | Freq: Once | INTRAMUSCULAR | Status: AC
  Administered 2022-03-31: 15 mg via INTRAVENOUS
  Filled 2022-03-31: qty 1

## 2022-03-31 MED ORDER — TAMSULOSIN HCL 0.4 MG PO CAPS
0.4000 mg | ORAL_CAPSULE | Freq: Once | ORAL | Status: AC
  Administered 2022-03-31: 0.4 mg via ORAL
  Filled 2022-03-31: qty 1

## 2022-03-31 MED ORDER — NAPROXEN 375 MG PO TABS: 375.0000 mg | ORAL_TABLET | Freq: Two times a day (BID) | ORAL | 0 refills | Status: DC

## 2022-03-31 MED ORDER — HYDROCODONE-ACETAMINOPHEN 5-325 MG PO TABS: 2.0000 | ORAL_TABLET | ORAL | 0 refills | Status: DC | PRN

## 2022-03-31 MED ORDER — OXYCODONE-ACETAMINOPHEN 5-325 MG PO TABS
1.0000 | ORAL_TABLET | Freq: Once | ORAL | Status: DC
  Filled 2022-03-31: qty 1

## 2022-03-31 MED ORDER — TAMSULOSIN HCL 0.4 MG PO CAPS: 0.4000 mg | ORAL_CAPSULE | Freq: Every day | ORAL | 0 refills | Status: AC

## 2022-03-31 MED ORDER — ONDANSETRON 4 MG PO TBDP: 4.0000 mg | ORAL_TABLET | Freq: Three times a day (TID) | ORAL | 0 refills | Status: DC | PRN

## 2022-03-31 MED ORDER — TAMSULOSIN HCL 0.4 MG PO CAPS: 0.4000 mg | ORAL_CAPSULE | Freq: Every day | ORAL | 0 refills | Status: DC

## 2022-03-31 MED ORDER — SODIUM CHLORIDE 0.9 % IV BOLUS
1000.0000 mL | Freq: Once | INTRAVENOUS | Status: AC
  Administered 2022-03-31: 1000 mL via INTRAVENOUS

## 2022-03-31 MED ORDER — ONDANSETRON 4 MG PO TBDP: 4.0000 mg | ORAL_TABLET | Freq: Three times a day (TID) | ORAL | 0 refills | Status: AC | PRN

## 2022-03-31 MED ORDER — ONDANSETRON HCL 4 MG/2ML IJ SOLN
4.0000 mg | Freq: Once | INTRAMUSCULAR | Status: AC
  Administered 2022-03-31: 4 mg via INTRAVENOUS
  Filled 2022-03-31: qty 2

## 2022-03-31 NOTE — ED Provider Notes (Signed)
[facility] EMERGENCY DEPARTMENT Provider Note  CSN: [id number] Arrival date & time: 03/31/22 0450  Chief Complaint(s) Flank Pain (Left)  HPI Kyle Richmond is a 46 y.o. male who presents to the emergency department with sudden onset left flank pain that began just prior to arrival.  Pain is deep and severe radiating to the groin.  Patient had similar symptoms several months ago while traveling to [place].  He reports evaluated in told he had a small kidney stone.  Reports that he was given medicine and has been pain-free since then.  He denies any dysuria.  Endorses nausea and episode of vomiting due to the pain.  No recent fevers or infections.  No cough or congestion.  No diarrhea.  No other physical complaints.  The history is provided by the patient.    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 Authorizing Provider  naproxen (NAPROSYN) 375 MG tablet Take 1 tablet (375 mg total) by mouth 2 (two) times daily. 03/31/22  Yes Mitchelle Goerner, Amadeo Garnet, MD  ondansetron (ZOFRAN-ODT) 4 MG disintegrating tablet Take 1 tablet (4 mg total) by mouth every 8 (eight) hours as needed for up to 3 days for nausea or vomiting. 03/31/22 04/03/22 Yes Mahogany Torrance, Amadeo Garnet, MD  tamsulosin (FLOMAX) 0.4 MG CAPS capsule Take 1 capsule (0.4 mg total) by mouth daily for 5 days. 03/31/22 04/05/22 Yes Margarita Bobrowski, Amadeo Garnet, MD  enoxaparin (LOVENOX) 40 MG/0.4ML injection Inject 0.4 mLs (40 mg total) into the skin 2 (two) times daily for 14 days. 09/10/19 09/24/19  Luretha Murphy, MD  fluticasone (FLONASE) 50 MCG/ACT nasal spray Place 2 sprays into both nostrils daily. Patient not taking: Reported on 08/18/2019 05/17/19   Eustace Moore, MD  levothyroxine (SYNTHROID, LEVOTHROID) 150 MCG tablet Take 150 mcg by mouth daily before breakfast.  06/08/16   [provider]  losartan (COZAAR) 50 MG tablet Take 50 mg by mouth daily. 06/02/16   [provider]  Multiple Vitamin (MULTIVITAMIN WITH MINERALS) TABS tablet Take 1 tablet by mouth daily.    [provider]  oxyCODONE (OXY IR/ROXICODONE) 5 MG immediate release tablet Take 1 tablet (5 mg total) by mouth every 6 (six) hours as needed for severe pain. 09/10/19   Luretha Murphy, MD  pantoprazole (PROTONIX) 40 MG tablet Take 1 tablet (40 mg total) by mouth daily. 09/10/19   Luretha Murphy, MD                                                                                                                                    Allergies Patient has no known allergies.  Review of Systems Review of Systems As noted in HPI  Physical Exam Vital Signs  I have reviewed the triage vital signs BP 129/85   Pulse 65   Temp 98.3 F (36.8 C) (Oral)   Resp 10   SpO2 98%   Physical Exam Vitals reviewed.  Constitutional:      General: He is not in acute distress.    Appearance: He is well-developed. He is not diaphoretic.     Comments: In obvious discomfort  HENT:     Head: Normocephalic and atraumatic.     Right Ear: External ear normal.     Left Ear: External ear normal.     Nose: Nose normal.     Mouth/Throat:     Mouth: Mucous membranes are moist.  Eyes:     General: No scleral icterus.    Conjunctiva/sclera: Conjunctivae normal.  Neck:     Trachea: Phonation normal.  Cardiovascular:     Rate and Rhythm: Normal rate and regular rhythm.  Pulmonary:     Effort: Pulmonary effort is normal. No respiratory distress.     Breath sounds: No stridor.  Abdominal:     General: There is no distension.     Tenderness: There is no abdominal tenderness.  Musculoskeletal:        General: Normal range of motion.     Cervical back: Normal range of  motion.  Neurological:     Mental Status: He is alert and oriented to person, place, and time.  Psychiatric:        Behavior: Behavior normal.     ED Results and Treatments Labs (all labs ordered are listed, but only abnormal results are displayed) Labs Reviewed  CBC WITH DIFFERENTIAL/PLATELET - Abnormal; Notable for the following components:      Result Value   Hemoglobin 12.9 (*)    MCV 73.8 (*)    MCH 23.8 (*)    RDW 16.2 (*)    All other components within normal limits  BASIC METABOLIC PANEL - Abnormal; Notable for the following components:   Glucose, Bld 113 (*)    All other components within normal limits  URINALYSIS, ROUTINE W REFLEX MICROSCOPIC                                                                                                                         EKG  EKG Interpretation  Date/Time:    Ventricular Rate:    PR Interval:    QRS Duration:   QT Interval:    QTC Calculation:   R Axis:     Text Interpretation:         Radiology CT Renal Stone Study  Result Date: 03/31/2022 CLINICAL DATA:  46 year old male with history of sudden onset of left-sided flank pain radiating into the left groin. EXAM: CT ABDOMEN AND PELVIS WITHOUT CONTRAST TECHNIQUE: Multidetector CT imaging of the abdomen and pelvis was performed following the standard protocol without IV contrast. RADIATION DOSE REDUCTION: This exam  was performed according to the departmental dose-optimization program which includes automated exposure control, adjustment of the mA and/or kV according to patient size and/or use of iterative reconstruction technique. COMPARISON:  No priors. FINDINGS: Lower chest: Unremarkable. Hepatobiliary: No suspicious cystic or solid hepatic lesions are confidently identified on today's noncontrast CT examination. Unenhanced appearance of the gallbladder is normal. Pancreas: No definite pancreatic mass or peripancreatic fluid collections or inflammatory changes are noted on  today's noncontrast CT examination. Spleen: Unremarkable. Adrenals/Urinary Tract: At the left ureterovesicular junction (axial image 76 of series 3) there is a 3 mm calculus which is associated with mild proximal left hydroureteronephrosis indicating mild obstruction. No additional calculi are noted within the collecting system of either kidney, along the course of the right ureter, or within the lumen of the urinary bladder. Unenhanced appearance of the kidneys, bilateral adrenal glands and urinary bladder is otherwise unremarkable. Stomach/Bowel: Postoperative changes of prior sleeve gastrectomy. No pathologic dilatation of small bowel or colon. Normal appendix. Vascular/Lymphatic: Atherosclerotic calcifications in the pelvic vasculature. No lymphadenopathy noted in the abdomen or pelvis. Reproductive: Prostate gland and seminal vesicles are unremarkable in appearance. Other: Postoperative changes of mesh repair for ventral or umbilical hernia are noted in the anterior abdominal wall. No significant volume of ascites. No pneumoperitoneum. Musculoskeletal: There are no aggressive appearing lytic or blastic lesions noted in the visualized portions of the skeleton. IMPRESSION: 1. 3 mm calculus at the left ureterovesicular junction with mild proximal left hydroureteronephrosis indicating mild left-sided obstruction at this time. 2. Additional incidental findings, as above. Electronically Signed   By: Vinnie Langton M.D.   On: 03/31/2022 05:47    Medications Ordered in ED Medications  sodium chloride 0.9 % bolus 1,000 mL (1,000 mLs Intravenous New Bag/Given 03/31/22 0521)  ketorolac (TORADOL) 15 MG/ML injection 15 mg (15 mg Intravenous Given 03/31/22 0521)  ondansetron (ZOFRAN) injection 4 mg (4 mg Intravenous Given 03/31/22 0521)  tamsulosin (FLOMAX) capsule 0.4 mg (0.4 mg Oral Given 03/31/22 0701)                                                                                                                                      Procedures Procedures  (including critical care time)  Medical Decision Making / ED Course   Medical Decision Making Amount and/or Complexity of Data Reviewed Labs: ordered. Decision-making details documented in ED Course. Radiology: ordered and independent interpretation performed. Decision-making details documented in ED Course.  Risk Prescription drug management.    Left flank pain no suspicious for renal colic. We will need to assess for evidence of urinary tract infection. In the interim patient provided with IV fluids and Toradol.  CBC without leukocytosis or anemia. Metabolic panel without significant electrolyte derangement or renal sufficiency UA pending  CT stone study confirmed 3 mm stone in the left UVJ.  Pain improved with Toradol.  Plan to follow up UA. Patient care turned over  to oncoming provider. Patient case and results discussed in detail; please see their note for further ED managment.       Final Clinical Impression(s) / ED Diagnoses Final diagnoses:  Ureterolithiasis           This chart was dictated using voice recognition software.  Despite best efforts to proofread,  errors can occur which can change the documentation meaning.    Nira Conn, MD 03/31/22 [phone]

## 2022-03-31 NOTE — ED Provider Notes (Signed)
  Physical Exam  BP (!) 138/100   Pulse 73   Temp 98.3 F (36.8 C) (Oral)   Resp 13   SpO2 97%   Physical Exam Constitutional:      General: He is not in acute distress.    Appearance: Normal appearance.  HENT:     Head: Normocephalic and atraumatic.     Nose: No congestion or rhinorrhea.  Eyes:     General:        Right eye: No discharge.        Left eye: No discharge.     Extraocular Movements: Extraocular movements intact.     Pupils: Pupils are equal, round, and reactive to light.  Cardiovascular:     Rate and Rhythm: Normal rate and regular rhythm.     Heart sounds: No murmur heard. Pulmonary:     Effort: No respiratory distress.     Breath sounds: No wheezing or rales.  Abdominal:     General: There is no distension.     Tenderness: There is no abdominal tenderness.  Musculoskeletal:        General: Normal range of motion.     Cervical back: Normal range of motion.  Skin:    General: Skin is warm and dry.  Neurological:     General: No focal deficit present.     Mental Status: He is alert.     Procedures  Procedures  ED Course / MDM    Medical Decision Making Amount and/or Complexity of Data Reviewed Labs: ordered. Radiology: ordered.  Risk Prescription drug management.   Patient received in signout.  Nephrolithiasis pending urinalysis.  Plan for discharge if urinalysis without infection.  Urinalysis without evidence of UTI here in the emergency department and he was discharged with pain control and outpatient urologic follow-up.       Teressa Lower, MD 03/31/22 [phone]

## 2022-03-31 NOTE — Telephone Encounter (Signed)
Patient called and states that his insurance does not cover medications at the current pharmacy so is requesting that they be sent to the [facility] at [facility] and [address]

## 2022-03-31 NOTE — ED Triage Notes (Signed)
Patient arrived to ED from home with complaints of sudden onset of left flank pain that radiates to left groin, woke him up from sleep, associated with nausea and vomiting. Hx of kidney stones, last one in July this year.

## 2023-03-25 ENCOUNTER — Other Ambulatory Visit: Payer: Self-pay

## 2023-03-25 ENCOUNTER — Encounter (HOSPITAL_COMMUNITY): Payer: Self-pay

## 2023-03-25 ENCOUNTER — Emergency Department (HOSPITAL_COMMUNITY)
Admission: EM | Admit: 2023-03-25 | Discharge: 2023-03-25 | Disposition: A | Discharge status: Home / Self Care | Payer: No Typology Code available for payment source | Attending: Emergency Medicine | Admitting: Emergency Medicine

## 2023-03-25 ENCOUNTER — Emergency Department (HOSPITAL_COMMUNITY): Payer: 59

## 2023-03-25 DIAGNOSIS — S51011A Laceration without foreign body of right elbow, initial encounter: Secondary | ICD-10-CM | POA: Diagnosis not present

## 2023-03-25 DIAGNOSIS — Y990 Civilian activity done for income or pay: Secondary | ICD-10-CM | POA: Diagnosis not present

## 2023-03-25 DIAGNOSIS — M79642 Pain in left hand: Secondary | ICD-10-CM | POA: Diagnosis not present

## 2023-03-25 DIAGNOSIS — W1789XA Other fall from one level to another, initial encounter: Secondary | ICD-10-CM | POA: Diagnosis not present

## 2023-03-25 DIAGNOSIS — S59901A Unspecified injury of right elbow, initial encounter: Secondary | ICD-10-CM | POA: Diagnosis present

## 2023-03-25 DIAGNOSIS — W19XXXA Unspecified fall, initial encounter: Secondary | ICD-10-CM

## 2023-03-25 DIAGNOSIS — M546 Pain in thoracic spine: Secondary | ICD-10-CM | POA: Diagnosis not present

## 2023-03-25 MED ORDER — OXYCODONE-ACETAMINOPHEN 5-325 MG PO TABS
2.0000 | ORAL_TABLET | Freq: Once | ORAL | Status: AC
  Administered 2023-03-25: 2 via ORAL
  Filled 2023-03-25: qty 2

## 2023-03-25 MED ORDER — OXYCODONE-ACETAMINOPHEN 5-325 MG PO TABS: 1.0000 | ORAL_TABLET | Freq: Three times a day (TID) | ORAL | 0 refills | Status: DC | PRN

## 2023-03-25 NOTE — Discharge Instructions (Signed)
As discussed, it is normal to feel worse in the days immediately following a fall regardless of medication use.  However, please take all medication as directed, use ice packs liberally.  If you develop any new, or concerning changes in your condition, please return here for further evaluation and management.    Otherwise, please follow-up with your physician and be sure to contact your occupational health office.

## 2023-03-25 NOTE — ED Provider Notes (Signed)
Care of the patient at signout.  Patient is awake, alert, in no distress. He has a history of bypass, we discussed pain medication regimens given his trauma, pain.  Patient discharged with work note, analgesics, occupational health follow-up.   Gerhard Munch, MD 03/25/23 [phone]

## 2023-03-25 NOTE — ED Provider Notes (Signed)
EMERGENCY DEPARTMENT AT [facility] Provider Note   CSN: [id number] Arrival date & time: 03/25/23  1216     History  Chief Complaint  Patient presents with   Kyle Richmond    Kyle Richmond is a 47 y.o. male.  HPI 47 year old male presents after a fall.  He was unloading off of a truck and fell about 4 feet.  He thinks he missed a step.  He does not think he hit his head or lost consciousness.  He is having severe pain to his right side of his back.  He states that he was unable to get up due to this pain.  No lower extremity trauma or weakness in his extremities.  He is not on blood thinners.  He denies any headache or neck pain.  The pain is primarily near his right scapula.  He also injured his left thumb and his right elbow. No preceding symptoms.  Home Medications Prior to Admission medications   Medication Sig Start Date End Date Taking? Authorizing Provider  enoxaparin (LOVENOX) 40 MG/0.4ML injection Inject 0.4 mLs (40 mg total) into the skin 2 (two) times daily for 14 days. 09/10/19 09/24/19  Luretha Murphy, MD  fluticasone (FLONASE) 50 MCG/ACT nasal spray Place 2 sprays into both nostrils daily. Patient not taking: Reported on 08/18/2019 05/17/19   Eustace Moore, MD  HYDROcodone-acetaminophen (NORCO/VICODIN) 5-325 MG tablet Take 2 tablets by mouth every 4 (four) hours as needed. 03/31/22   Kommor, Madison, MD  levothyroxine (SYNTHROID, LEVOTHROID) 150 MCG tablet Take 150 mcg by mouth daily before breakfast.  06/08/16   [provider]  losartan (COZAAR) 50 MG tablet Take 50 mg by mouth daily. 06/02/16   [provider]  Multiple Vitamin (MULTIVITAMIN WITH MINERALS) TABS tablet Take 1 tablet by mouth daily.    [provider]  naproxen (NAPROSYN) 375 MG tablet Take 1 tablet (375 mg total) by mouth 2 (two) times daily. 03/31/22   Rondel Baton, MD  oxyCODONE (OXY IR/ROXICODONE) 5 MG immediate release tablet Take 1 tablet (5 mg total)  by mouth every 6 (six) hours as needed for severe pain. 09/10/19   Luretha Murphy, MD  pantoprazole (PROTONIX) 40 MG tablet Take 1 tablet (40 mg total) by mouth daily. 09/10/19   Luretha Murphy, MD      Allergies    Patient has no known allergies.    Review of Systems   Review of Systems  Respiratory:  Negative for shortness of breath.   Cardiovascular:  Negative for chest pain.  Musculoskeletal:  Positive for arthralgias and back pain. Negative for neck pain.  Neurological:  Negative for weakness, numbness and headaches.    Physical Exam Updated Vital Signs BP (!) 132/96 (BP Location: Left Arm)   Pulse 83   Temp 98.5 F (36.9 C) (Oral)   Resp 18   SpO2 98%  Physical Exam Vitals and nursing note reviewed.  Constitutional:      Appearance: He is well-developed. He is obese.  HENT:     Head: Normocephalic and atraumatic.  Eyes:     Extraocular Movements: Extraocular movements intact.  Cardiovascular:     Rate and Rhythm: Normal rate and regular rhythm.     Pulses:          Radial pulses are 2+ on the right side and 2+ on the left side.     Heart sounds: Normal heart sounds.  Pulmonary:     Effort: Pulmonary effort is normal.  Breath sounds: Normal breath sounds.  Chest:     Chest wall: No tenderness.  Abdominal:     General: There is no distension.     Palpations: Abdomen is soft.     Tenderness: There is no abdominal tenderness.  Musculoskeletal:     Right shoulder: No swelling or tenderness. Normal range of motion.     Right elbow: Laceration (small abrasion to proximal forearm at elbow) present. Normal range of motion. Tenderness present.     Left hand: Tenderness (tenderness over distal left thumb. there is some dried blood near nail but no overt nail injury or ungual hematoma) present. No swelling or deformity. Normal range of motion.     Cervical back: No tenderness. No spinous process tenderness or muscular tenderness.     Thoracic back: Tenderness present.      Lumbar back: No tenderness.       Back:     Right hip: Normal range of motion.     Left hip: Normal range of motion.  Skin:    General: Skin is warm and dry.  Neurological:     Mental Status: He is alert.     Comments: Awake, alert, speaking clearly. 5/5 strength in all 4 extremities     ED Results / Procedures / Treatments   Labs (all labs ordered are listed, but only abnormal results are displayed) Labs Reviewed - No data to display  EKG None  Radiology No results found.  Procedures Procedures    Medications Ordered in ED Medications  oxyCODONE-acetaminophen (PERCOCET/ROXICET) 5-325 MG per tablet 2 tablet (2 tablets Oral Given 03/25/23 1324)    ED Course/ Medical Decision Making/ A&P                                 Medical Decision Making Amount and/or Complexity of Data Reviewed Radiology: ordered.  Risk Prescription drug management.   Patient denies any headache or obvious head injury or neck pain.  No neurodeficits.  He is not on blood thinners.  Given the height of fall and no complaints in his head/neck area I think is reasonable to hold off on imaging of head and C-spine.  X-rays are currently pending.  Care transferred to Dr. Jeraldine Loots.        Final Clinical Impression(s) / ED Diagnoses Final diagnoses:  None    Rx / DC Orders ED Discharge Orders     None         Pricilla Loveless, MD 03/25/23 1630

## 2023-03-25 NOTE — ED Triage Notes (Signed)
Patient fell 4 ft from platform to right shoulder. Patient denies LOC and hitting his head, no visible trauma or neck pain. EMS reports patient visibly uncomfortable en route but no deformity noted. A&Ox4, VSS.

## 2023-04-04 ENCOUNTER — Encounter (HOSPITAL_COMMUNITY): Payer: Self-pay | Admitting: *Deleted

## 2023-06-15 NOTE — H&P (Signed)
PREOPERATIVE H&P  Chief Complaint: right shoulder cartilage disorder, impingement syndrome, biceps tendonitis, OA, rotator cuff tear  HPI: Kyle Richmond is a 47 y.o. male who is scheduled for, Procedure(s): SHOULDER ARTHROSCOPY WITH SUBACROMIAL DECOMPRESSION, ROTATOR CUFF REPAIR AND BICEP TENDON REPAIR SHOULDER ARTHROSCOPY WITH DISTAL CLAVICLE EXCISION.   Patient has a past medical history significant for HTN, obesity, OSA on CPAP.   The patient fell off a truck in September. He has tried and failed nonoperative measures including therapy. He has pain in his right shoulder. He is a Brewing technologist, working on [employer].  Symptoms are rated as moderate to severe, and have been worsening.  This is significantly impairing activities of daily living.    Please see clinic note for further details on this patient's care.    He has elected for surgical management.   Past Medical History:  Diagnosis Date   Hypertension    Obesity    Sleep apnea    cpap   Past Surgical History:  Procedure Laterality Date   DIAGNOSTIC LAPAROSCOPY     bands   HERNIA REPAIR     LAPAROSCOPIC GASTRIC BAND REMOVAL WITH LAPAROSCOPIC GASTRIC SLEEVE RESECTION N/A 09/08/2019   Procedure: LAPAROSCOPIC GASTRIC BAND REMOVAL WITH LAPAROSCOPIC GASTRIC SLEEVE RESECTION, UPPER ENDO ERAS PATHWAY;  Surgeon: Luretha Murphy, MD;  Location: [facility] ORS;  Service: General;  Laterality: N/A;   THYROID SURGERY     Social History   Socioeconomic History   Marital status: Married    Spouse name: Not on file   Number of children: Not on file   Years of education: Not on file   Highest education level: Not on file  Occupational History   Not on file  Tobacco Use   Smoking status: Never   Smokeless tobacco: Never  Vaping Use   Vaping status: Never Used  Substance and Sexual Activity   Alcohol use: Yes    Comment: occ   Drug use: No   Sexual activity: Not on file  Other Topics Concern   Not on file  Social  History Narrative   Not on file   Social Drivers of Health   Financial Resource Strain: Not on file  Food Insecurity: Not on file  Transportation Needs: Not on file  Physical Activity: Not on file  Stress: Not on file  Social Connections: Unknown (03/29/2023)   Received from [facility]   Social Network    Social Network: Not on file   No family history on file. No Known Allergies Prior to Admission medications   Medication Sig Start Date End Date Taking? Authorizing Provider  enoxaparin (LOVENOX) 40 MG/0.4ML injection Inject 0.4 mLs (40 mg total) into the skin 2 (two) times daily for 14 days. 09/10/19 09/24/19  Luretha Murphy, MD  fluticasone (FLONASE) 50 MCG/ACT nasal spray Place 2 sprays into both nostrils daily. Patient not taking: Reported on 08/18/2019 05/17/19   Eustace Moore, MD  HYDROcodone-acetaminophen (NORCO/VICODIN) 5-325 MG tablet Take 2 tablets by mouth every 4 (four) hours as needed. 03/31/22   Kommor, Madison, MD  levothyroxine (SYNTHROID, LEVOTHROID) 150 MCG tablet Take 150 mcg by mouth daily before breakfast.  06/08/16   [provider]  losartan (COZAAR) 50 MG tablet Take 50 mg by mouth daily. 06/02/16   [provider]  Multiple Vitamin (MULTIVITAMIN WITH MINERALS) TABS tablet Take 1 tablet by mouth daily.    [provider]  naproxen (NAPROSYN) 375 MG tablet Take 1 tablet (375 mg  total) by mouth 2 (two) times daily. 03/31/22   Rondel Baton, MD  oxyCODONE (OXY IR/ROXICODONE) 5 MG immediate release tablet Take 1 tablet (5 mg total) by mouth every 6 (six) hours as needed for severe pain. 09/10/19   Luretha Murphy, MD  oxyCODONE-acetaminophen (PERCOCET/ROXICET) 5-325 MG tablet Take 1 tablet by mouth every 8 (eight) hours as needed for severe pain. 03/25/23   Gerhard Munch, MD  pantoprazole (PROTONIX) 40 MG tablet Take 1 tablet (40 mg total) by mouth daily. 09/10/19   Luretha Murphy, MD    ROS: All other systems have been reviewed  and were otherwise negative with the exception of those mentioned in the HPI and as above.  Physical Exam: General: Alert, no acute distress Cardiovascular: No pedal edema Respiratory: No cyanosis, no use of accessory musculature GI: No organomegaly, abdomen is soft and non-tender Skin: No lesions in the area of chief complaint Neurologic: Sensation intact distally Psychiatric: Patient is competent for consent with normal mood and affect Lymphatic: No axillary or cervical lymphadenopathy  MUSCULOSKELETAL:  On exam, range of motion of the shoulder is full. He has a positive O'Brien's and positive impingement, positive AC tenderness to palpation, and positive bicipital groove tenderness to palpation.   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     Assessment: right shoulder cartilage disorder, impingement syndrome, biceps tendonitis, OA, rotator cuff tear  Plan: Plan for Procedure(s): SHOULDER ARTHROSCOPY WITH SUBACROMIAL DECOMPRESSION, ROTATOR CUFF REPAIR AND BICEP TENDON REPAIR SHOULDER ARTHROSCOPY WITH DISTAL CLAVICLE EXCISION   The risks benefits and alternatives were discussed with the patient including but not limited to the risks of nonoperative treatment, versus surgical intervention including infection, bleeding, nerve injury,  blood clots, cardiopulmonary complications, morbidity, mortality, among others, and they were willing to proceed.   The patient acknowledged the explanation, agreed to proceed with the plan and consent was signed.   Operative Plan: Right shoulder arthroscopy, biceps tenodesis, subacromial decompression, distal clavicle excision  Discharge Medications: Oxycodone, zofran, tylenol DVT Prophylaxis: n/a Physical Therapy: outpatient Special Discharge needs: Sling, iceman    Corinna Capra, PA-C  06/15/2023 11:55 AM

## 2023-07-03 ENCOUNTER — Encounter (HOSPITAL_BASED_OUTPATIENT_CLINIC_OR_DEPARTMENT_OTHER): Payer: Self-pay | Admitting: Orthopaedic Surgery

## 2023-07-03 ENCOUNTER — Other Ambulatory Visit: Payer: Self-pay

## 2023-07-03 NOTE — Progress Notes (Signed)
   07/03/23 1207  Pre-op Phone Call  Surgery Date Verified 07/11/23  Arrival Time Verified (S)   (Dr Henri office will inform pt of time)  Surgery Location Verified [facility] [place]  Medical History Reviewed Yes  Is the patient taking a GLP-1 receptor agonist? No  Does the patient have diabetes? No diagnosis of diabetes  Do you have a history of heart problems? No (HTN)  Antiarrhythmic device type  (NA)  Does patient have other implanted devices? No  Patient Teaching Pre / Post Procedure  Patient educated about smoking cessation 24 hours prior to surgery. N/A Non-Smoker  Patient verbalizes understanding of bowel prep? N/A  Med Rec Completed Yes  Take the Following Meds the Morning of Surgery take synthroid with sip water AM of sx; hold losartan  AM of sx until after sx; hold mvi x5d  Recent  Lab Work, EKG, CXR? No  NPO (Including gum & candy) After midnight  Stop Solids, Milk, Candy, and Gum STARTING AT MIDNIGHT  Responsible adult to drive and be with you for 24 hours? Yes  Name & Phone Number for Ride/Caregiver wife, Antillia  No Jewelry, money, nail polish or make-up.  No lotions, powders, perfumes. No shaving  48 hrs. prior to surgery. Yes  Contacts, Dentures & Glasses Will Have to be Removed Before OR. Yes  Please bring your ID and Insurance Card the morning of your surgery. (Surgery Centers Only) Yes  Bring any papers or x-rays with you that your surgeon gave you. Yes  Call this number the morning of surgery  with any problems that may cancel your surgery. [phone]  Covid-19 Assessment  Have you had a positive COVID-19 test within the previous 90 days? No  COVID Testing Guidance Proceed with the additional questions.  Patient's surgery required a COVID-19 test (cardiothoracic, complex ENT, and bronchoscopies/ EBUS) No  Have you been unmasked and in close contact with anyone with COVID-19 or COVID-19 symptoms within the past 10 days? No  Do you or anyone in your household currently  have any COVID-19 symptoms? No

## 2023-07-04 ENCOUNTER — Encounter (HOSPITAL_BASED_OUTPATIENT_CLINIC_OR_DEPARTMENT_OTHER)
Admission: RE | Admit: 2023-07-04 | Discharge: 2023-07-04 | Disposition: A | Discharge status: Home / Self Care | Payer: No Typology Code available for payment source | Source: Ambulatory Visit | Attending: Orthopaedic Surgery | Admitting: Orthopaedic Surgery

## 2023-07-04 DIAGNOSIS — Z01818 Encounter for other preprocedural examination: Secondary | ICD-10-CM | POA: Diagnosis present

## 2023-07-10 NOTE — Discharge Instructions (Addendum)
Ramond Marrow MD, MPH Alfonse Alpers, PA-C [facility] [address] [phone] (tel)   [phone] (fax)   POST-OPERATIVE INSTRUCTIONS - SHOULDER ARTHROSCOPY  WOUND CARE You may remove the Operative Dressing on Post-Op Day #3 (72hrs after surgery).   Alternatively if you would like you can leave dressing on until follow-up if within 7-8 days but keep it dry. Leave steri-strips in place until they fall off on their own, usually 2 weeks postop. There may be a small amount of fluid/bleeding leaking at the surgical site.  This is normal; the shoulder is filled with fluid during the procedure and can leak for 24-48hrs after surgery.  You may change/reinforce the bandage as needed.  Use the Cryocuff or Ice as often as possible for the first 7 days, then as needed for pain relief. Always keep a towel, ACE wrap or other barrier between the cooling unit and your skin.  You may shower on Post-Op Day #3. Gently pat the area dry.  Do not soak the shoulder in water or submerge it.  Keep incisions as dry as possible. Do not go swimming in the pool or ocean until 4 weeks after surgery or when otherwise instructed.    EXERCISES Wear the sling at all times  You may remove the sling for showering, but keep the arm across the chest or in a secondary sling.     It is normal for your fingers/hand to become more swollen after surgery and discolored from bruising.   This will resolve over the first few weeks usually after surgery. Please continue to ambulate and do not stay sitting or lying for too long.  Perform foot and wrist pumps to assist in circulation.  PHYSICAL THERAPY - You will begin physical therapy soon after surgery (unless otherwise specified) - Please call to set up an appointment, if you do not already have one  - Let our office if there are any issues with scheduling your therapy  - You have a physical therapy appointment scheduled at [facility] (across  the hall from our office) on 1/20   REGIONAL ANESTHESIA (NERVE BLOCKS) The anesthesia team may have performed a nerve block for you this is a great tool used to minimize pain.   The block may start wearing off overnight (between 8-24 hours postop) When the block wears off, your pain may go from nearly zero to the pain you would have had postop without the block. This is an abrupt transition but nothing dangerous is happening.   This can be a challenging period but utilize your as needed pain medications to try and manage this period. We suggest you use the pain medication the first night prior to going to bed, to ease this transition.  You may take an extra dose of narcotic when this happens if needed  POST-OP MEDICATIONS- Multimodal approach to pain control In general your pain will be controlled with a combination of substances.  Prescriptions unless otherwise discussed are electronically sent to your pharmacy.  This is a carefully made plan we use to minimize narcotic use.     Celebrex - Anti-inflammatory medication taken on a scheduled basis Acetaminophen - Non-narcotic pain medicine taken on a scheduled basis  Oxycodone - This is a strong narcotic, to be used only on an "as needed" basis for SEVERE pain. Zofran - take as needed for nausea   FOLLOW-UP If you develop a Fever (>=101.5), Redness or Drainage from the surgical incision site, please call  our office to arrange for an evaluation. Please call the office to schedule a follow-up appointment for your first post-operative appointment, 7-10 days post-operatively.    HELPFUL INFORMATION   You may be more comfortable sleeping in a semi-seated position the first few nights following surgery.  Keep a pillow propped under the elbow and forearm for comfort.  If you have a recliner type of chair it might be beneficial.  If not that is fine too, but it would be helpful to sleep propped up with pillows behind your operated shoulder as  well under your elbow and forearm.  This will reduce pulling on the suture lines.  When dressing, put your operative arm in the sleeve first.  When getting undressed, take your operative arm out last.  Loose fitting, button-down shirts are recommended.  Often in the first days after surgery you may be more comfortable keeping your operative arm under your shirt and not through the sleeve.  You may return to work/school in the next couple of days when you feel up to it.  Desk work and typing in the sling is fine.  We suggest you use the pain medication the first night prior to going to bed, in order to ease any pain when the anesthesia wears off. You should avoid taking pain medications on an empty stomach as it will make you nauseous.  You should wean off your narcotic medicines as soon as you are able.  Most patients will be off narcotics before their first postop appointment.   Do not drink alcoholic beverages or take illicit drugs when taking pain medications.  It is against the law to drive while taking narcotics.  In some states it is against the law to drive while your arm is in a sling.   Pain medication may make you constipated.  Below are a few solutions to try in this order: Decrease the amount of pain medication if you aren't having pain. Drink lots of decaffeinated fluids. Drink prune juice and/or eat dried prunes  If the first 3 don't work start with additional solutions Take Colace - an over-the-counter stool softener Take Senokot - an over-the-counter laxative Take Miralax - a stronger over-the-counter laxative  For more information including helpful videos and documents visit our website:   [email or web address]   No Tylenol until 6:30pm  Information for Discharge Teaching: EXPAREL (bupivacaine liposome injectable suspension)   Pain relief is important to your recovery. The goal is to control your pain so you can move easier and return  to your normal activities as soon as possible after your procedure. Your physician may use several types of medicines to manage pain, swelling, and more.  Your surgeon or anesthesiologist gave you EXPAREL(bupivacaine) to help control your pain after surgery.  EXPAREL is a local anesthetic designed to release slowly over an extended period of time to provide pain relief by numbing the tissue around the surgical site. EXPAREL is designed to release pain medication over time and can control pain for up to 72 hours. Depending on how you respond to EXPAREL, you may require less pain medication during your recovery. EXPAREL can help reduce or eliminate the need for opioids during the first few days after surgery when pain relief is needed the most. EXPAREL is not an opioid and is not addictive. It does not cause sleepiness or sedation.   Important! A teal colored band has been placed on your arm with the date, time and amount of EXPAREL you have received. Please  leave this armband in place for the full 96 hours following administration, and then you may remove the band. If you return to the hospital for any reason within 96 hours following the administration of EXPAREL, the armband provides important information that your health care providers to know, and alerts them that you have received this anesthetic.    Possible side effects of EXPAREL: Temporary loss of sensation or ability to move in the area where medication was injected. Nausea, vomiting, constipation Rarely, numbness and tingling in your mouth or lips, lightheadedness, or anxiety may occur. Call your doctor right away if you think you may be experiencing any of these sensations, or if you have other questions regarding possible side effects.  Follow all other discharge instructions given to you by your surgeon or nurse. Eat a healthy diet and drink plenty of water or other fluids.

## 2023-07-11 ENCOUNTER — Encounter (HOSPITAL_BASED_OUTPATIENT_CLINIC_OR_DEPARTMENT_OTHER): Payer: Self-pay | Admitting: Orthopaedic Surgery

## 2023-07-11 ENCOUNTER — Ambulatory Visit (HOSPITAL_BASED_OUTPATIENT_CLINIC_OR_DEPARTMENT_OTHER): Payer: Self-pay | Admitting: Anesthesiology

## 2023-07-11 ENCOUNTER — Other Ambulatory Visit: Payer: Self-pay

## 2023-07-11 ENCOUNTER — Ambulatory Visit (HOSPITAL_BASED_OUTPATIENT_CLINIC_OR_DEPARTMENT_OTHER): Payer: No Typology Code available for payment source | Admitting: Anesthesiology

## 2023-07-11 ENCOUNTER — Ambulatory Visit (HOSPITAL_BASED_OUTPATIENT_CLINIC_OR_DEPARTMENT_OTHER)
Admission: RE | Admit: 2023-07-11 | Discharge: 2023-07-11 | Disposition: A | Discharge status: Home / Self Care | Payer: Self-pay | Attending: Orthopaedic Surgery | Admitting: Orthopaedic Surgery

## 2023-07-11 ENCOUNTER — Encounter (HOSPITAL_BASED_OUTPATIENT_CLINIC_OR_DEPARTMENT_OTHER)
Admission: RE | Disposition: A | Discharge status: Home / Self Care | Payer: Self-pay | Source: Home / Self Care | Attending: Orthopaedic Surgery

## 2023-07-11 DIAGNOSIS — E66813 Obesity, class 3: Secondary | ICD-10-CM | POA: Insufficient documentation

## 2023-07-11 DIAGNOSIS — S43431A Superior glenoid labrum lesion of right shoulder, initial encounter: Secondary | ICD-10-CM | POA: Diagnosis not present

## 2023-07-11 DIAGNOSIS — Z419 Encounter for procedure for purposes other than remedying health state, unspecified: Secondary | ICD-10-CM

## 2023-07-11 DIAGNOSIS — Z79890 Hormone replacement therapy (postmenopausal): Secondary | ICD-10-CM | POA: Diagnosis not present

## 2023-07-11 DIAGNOSIS — Z79899 Other long term (current) drug therapy: Secondary | ICD-10-CM | POA: Diagnosis not present

## 2023-07-11 DIAGNOSIS — M7521 Bicipital tendinitis, right shoulder: Secondary | ICD-10-CM

## 2023-07-11 DIAGNOSIS — S46011A Strain of muscle(s) and tendon(s) of the rotator cuff of right shoulder, initial encounter: Secondary | ICD-10-CM | POA: Diagnosis not present

## 2023-07-11 DIAGNOSIS — W19XXXA Unspecified fall, initial encounter: Secondary | ICD-10-CM | POA: Diagnosis not present

## 2023-07-11 DIAGNOSIS — E039 Hypothyroidism, unspecified: Secondary | ICD-10-CM

## 2023-07-11 DIAGNOSIS — G4733 Obstructive sleep apnea (adult) (pediatric): Secondary | ICD-10-CM | POA: Diagnosis not present

## 2023-07-11 DIAGNOSIS — I10 Essential (primary) hypertension: Secondary | ICD-10-CM

## 2023-07-11 DIAGNOSIS — Z6841 Body Mass Index (BMI) 40.0 and over, adult: Secondary | ICD-10-CM | POA: Insufficient documentation

## 2023-07-11 DIAGNOSIS — M7541 Impingement syndrome of right shoulder: Secondary | ICD-10-CM | POA: Diagnosis present

## 2023-07-11 DIAGNOSIS — M19011 Primary osteoarthritis, right shoulder: Secondary | ICD-10-CM | POA: Diagnosis not present

## 2023-07-11 HISTORY — PX: SHOULDER ARTHROSCOPY WITH DISTAL CLAVICLE RESECTION: SHX5675

## 2023-07-11 HISTORY — PX: SHOULDER ARTHROSCOPY WITH SUBACROMIAL DECOMPRESSION, ROTATOR CUFF REPAIR AND BICEP TENDON REPAIR: SHX5687

## 2023-07-11 SURGERY — SHOULDER ARTHROSCOPY WITH SUBACROMIAL DECOMPRESSION, ROTATOR CUFF REPAIR AND BICEP TENDON REPAIR
Anesthesia: General | Site: Shoulder | Laterality: Right

## 2023-07-11 MED ORDER — ACETAMINOPHEN 500 MG PO TABS
ORAL_TABLET | ORAL | Status: AC
Start: 2023-07-11 — End: ?
  Filled 2023-07-11: qty 2

## 2023-07-11 MED ORDER — SODIUM CHLORIDE 0.9 % IR SOLN
Status: DC | PRN
  Administered 2023-07-11: 3000 mL

## 2023-07-11 MED ORDER — TRANEXAMIC ACID-NACL 1000-0.7 MG/100ML-% IV SOLN
INTRAVENOUS | Status: AC
  Filled 2023-07-11: qty 100

## 2023-07-11 MED ORDER — ONDANSETRON HCL 4 MG PO TABS: 4.0000 mg | ORAL_TABLET | Freq: Three times a day (TID) | ORAL | 0 refills | Status: AC | PRN

## 2023-07-11 MED ORDER — SODIUM CHLORIDE 0.9 % IV SOLN
INTRAVENOUS | Status: AC
  Filled 2023-07-11: qty 3

## 2023-07-11 MED ORDER — FENTANYL CITRATE (PF) 100 MCG/2ML IJ SOLN
25.0000 ug | INTRAMUSCULAR | Status: DC | PRN
Start: 2023-07-11 — End: 2023-07-11

## 2023-07-11 MED ORDER — LIDOCAINE 2% (20 MG/ML) 5 ML SYRINGE
INTRAMUSCULAR | Status: DC | PRN
  Administered 2023-07-11: 50 mg via INTRAVENOUS

## 2023-07-11 MED ORDER — DEXAMETHASONE SODIUM PHOSPHATE 4 MG/ML IJ SOLN
INTRAMUSCULAR | Status: DC | PRN
  Administered 2023-07-11: 10 mg via INTRAVENOUS

## 2023-07-11 MED ORDER — OXYCODONE HCL 5 MG/5ML PO SOLN: 5.0000 mg | Freq: Once | ORAL | Status: DC | PRN

## 2023-07-11 MED ORDER — ONDANSETRON HCL 4 MG/2ML IJ SOLN
INTRAMUSCULAR | Status: DC | PRN
  Administered 2023-07-11: 4 mg via INTRAVENOUS

## 2023-07-11 MED ORDER — MIDAZOLAM HCL 2 MG/2ML IJ SOLN
INTRAMUSCULAR | Status: AC
  Filled 2023-07-11: qty 2

## 2023-07-11 MED ORDER — MIDAZOLAM HCL 2 MG/2ML IJ SOLN
2.0000 mg | Freq: Once | INTRAMUSCULAR | Status: AC
  Administered 2023-07-11: 2 mg via INTRAVENOUS

## 2023-07-11 MED ORDER — ONDANSETRON HCL 4 MG/2ML IJ SOLN
INTRAMUSCULAR | Status: AC
  Filled 2023-07-11: qty 2

## 2023-07-11 MED ORDER — PROPOFOL 10 MG/ML IV BOLUS
INTRAVENOUS | Status: DC | PRN
  Administered 2023-07-11: 200 mg via INTRAVENOUS

## 2023-07-11 MED ORDER — DEXAMETHASONE SODIUM PHOSPHATE 10 MG/ML IJ SOLN
INTRAMUSCULAR | Status: AC
  Filled 2023-07-11: qty 1

## 2023-07-11 MED ORDER — BUPIVACAINE-EPINEPHRINE (PF) 0.5% -1:200000 IJ SOLN
INTRAMUSCULAR | Status: DC | PRN
  Administered 2023-07-11: 15 mL via PERINEURAL

## 2023-07-11 MED ORDER — SODIUM CHLORIDE 0.9 % IV SOLN: INTRAVENOUS | Status: DC | PRN

## 2023-07-11 MED ORDER — BUPIVACAINE LIPOSOME 1.3 % IJ SUSP
INTRAMUSCULAR | Status: DC | PRN
  Administered 2023-07-11: 10 mL via PERINEURAL

## 2023-07-11 MED ORDER — GLYCOPYRROLATE PF 0.2 MG/ML IJ SOSY
PREFILLED_SYRINGE | INTRAMUSCULAR | Status: AC
  Filled 2023-07-11: qty 1

## 2023-07-11 MED ORDER — DROPERIDOL 2.5 MG/ML IJ SOLN: 0.6250 mg | Freq: Once | INTRAMUSCULAR | Status: DC | PRN

## 2023-07-11 MED ORDER — OXYCODONE HCL 5 MG PO TABS: ORAL_TABLET | ORAL | 0 refills | Status: AC

## 2023-07-11 MED ORDER — SUGAMMADEX SODIUM 200 MG/2ML IV SOLN
INTRAVENOUS | Status: DC | PRN
  Administered 2023-07-11: 300 mg via INTRAVENOUS

## 2023-07-11 MED ORDER — SODIUM CHLORIDE 0.9 % IV SOLN
3.0000 g | INTRAVENOUS | Status: AC
  Administered 2023-07-11: 3 g via INTRAVENOUS

## 2023-07-11 MED ORDER — LACTATED RINGERS IV SOLN: INTRAVENOUS | Status: DC

## 2023-07-11 MED ORDER — ROCURONIUM BROMIDE 10 MG/ML (PF) SYRINGE
PREFILLED_SYRINGE | INTRAVENOUS | Status: DC | PRN
  Administered 2023-07-11: 60 mg via INTRAVENOUS

## 2023-07-11 MED ORDER — CELECOXIB 100 MG PO CAPS: 100.0000 mg | ORAL_CAPSULE | Freq: Two times a day (BID) | ORAL | 0 refills | Status: AC

## 2023-07-11 MED ORDER — PHENYLEPHRINE 80 MCG/ML (10ML) SYRINGE FOR IV PUSH (FOR BLOOD PRESSURE SUPPORT)
PREFILLED_SYRINGE | INTRAVENOUS | Status: DC | PRN
  Administered 2023-07-11 (×2): 80 ug via INTRAVENOUS

## 2023-07-11 MED ORDER — FENTANYL CITRATE (PF) 100 MCG/2ML IJ SOLN
50.0000 ug | Freq: Once | INTRAMUSCULAR | Status: AC
  Administered 2023-07-11: 50 ug via INTRAVENOUS

## 2023-07-11 MED ORDER — ACETAMINOPHEN 500 MG PO TABS: 1000.0000 mg | ORAL_TABLET | Freq: Three times a day (TID) | ORAL | 0 refills | Status: AC

## 2023-07-11 MED ORDER — OXYCODONE HCL 5 MG PO TABS: 5.0000 mg | ORAL_TABLET | Freq: Once | ORAL | Status: DC | PRN

## 2023-07-11 MED ORDER — TRANEXAMIC ACID-NACL 1000-0.7 MG/100ML-% IV SOLN
1000.0000 mg | INTRAVENOUS | Status: AC
  Administered 2023-07-11: 1000 mg via INTRAVENOUS

## 2023-07-11 MED ORDER — FENTANYL CITRATE (PF) 100 MCG/2ML IJ SOLN
INTRAMUSCULAR | Status: AC
  Filled 2023-07-11: qty 2

## 2023-07-11 MED ORDER — PHENYLEPHRINE 80 MCG/ML (10ML) SYRINGE FOR IV PUSH (FOR BLOOD PRESSURE SUPPORT)
PREFILLED_SYRINGE | INTRAVENOUS | Status: AC
  Filled 2023-07-11: qty 10

## 2023-07-11 MED ORDER — LIDOCAINE 2% (20 MG/ML) 5 ML SYRINGE
INTRAMUSCULAR | Status: AC
  Filled 2023-07-11: qty 5

## 2023-07-11 MED ORDER — ACETAMINOPHEN 500 MG PO TABS
1000.0000 mg | ORAL_TABLET | Freq: Once | ORAL | Status: AC
  Administered 2023-07-11: 1000 mg via ORAL

## 2023-07-11 SURGICAL SUPPLY — 49 items
ANCHOR SUT 1.8 FIBERTAK SB KL (Anchor) IMPLANT
BLADE EXCALIBUR 4.0X13 (MISCELLANEOUS) ×1 IMPLANT
BURR OVAL 8 FLU 4.0X13 (MISCELLANEOUS) ×1 IMPLANT
CANNULA 5.75X71 LONG (CANNULA) IMPLANT
CANNULA PASSPORT 5 (CANNULA) IMPLANT
CANNULA PASSPORT BUTTON 10-40 (CANNULA) IMPLANT
CANNULA TWIST IN 8.25X7CM (CANNULA) IMPLANT
CHLORAPREP W/TINT 26 (MISCELLANEOUS) ×1 IMPLANT
CLSR STERI-STRIP ANTIMIC 1/2X4 (GAUZE/BANDAGES/DRESSINGS) ×1 IMPLANT
COOLER ICEMAN CLASSIC (MISCELLANEOUS) ×1 IMPLANT
DRAPE IMP U-DRAPE 54X76 (DRAPES) ×1 IMPLANT
DRAPE INCISE IOBAN 66X45 STRL (DRAPES) IMPLANT
DRAPE SHOULDER BEACH CHAIR (DRAPES) ×1 IMPLANT
DW OUTFLOW CASSETTE/TUBE SET (MISCELLANEOUS) ×1 IMPLANT
GAUZE PAD ABD 8X10 STRL (GAUZE/BANDAGES/DRESSINGS) ×1 IMPLANT
GAUZE SPONGE 4X4 12PLY STRL (GAUZE/BANDAGES/DRESSINGS) ×1 IMPLANT
GLOVE BIO SURGEON STRL SZ 6.5 (GLOVE) ×1 IMPLANT
GLOVE BIOGEL PI IND STRL 6.5 (GLOVE) ×1 IMPLANT
GLOVE BIOGEL PI IND STRL 8 (GLOVE) ×1 IMPLANT
GLOVE ECLIPSE 8.0 STRL XLNG CF (GLOVE) ×1 IMPLANT
GOWN STRL REUS W/ TWL LRG LVL3 (GOWN DISPOSABLE) ×2 IMPLANT
GOWN STRL REUS W/TWL XL LVL3 (GOWN DISPOSABLE) ×1 IMPLANT
KIT STABILIZATION SHOULDER (MISCELLANEOUS) ×1 IMPLANT
KIT STR SPEAR 1.8 FBRTK DISP (KITS) IMPLANT
LASSO CRESCENT QUICKPASS (SUTURE) IMPLANT
MANIFOLD NEPTUNE II (INSTRUMENTS) ×1 IMPLANT
NDL HD SCORPION MEGA LOADER (NEEDLE) IMPLANT
NDL SAFETY ECLIPSE 18X1.5 (NEEDLE) ×1 IMPLANT
PACK ARTHROSCOPY DSU (CUSTOM PROCEDURE TRAY) ×1 IMPLANT
PACK BASIN DAY SURGERY FS (CUSTOM PROCEDURE TRAY) ×1 IMPLANT
PAD COLD SHLDR WRAP-ON (PAD) ×1 IMPLANT
RESTRAINT HEAD UNIVERSAL NS (MISCELLANEOUS) ×1 IMPLANT
SHEET MEDIUM DRAPE 40X70 STRL (DRAPES) ×1 IMPLANT
SLEEVE SCD COMPRESS KNEE MED (STOCKING) ×1 IMPLANT
SLING ARM FOAM STRAP LRG (SOFTGOODS) IMPLANT
SUT FIBERWIRE #2 38 T-5 BLUE (SUTURE) IMPLANT
SUT MNCRL AB 4-0 PS2 18 (SUTURE) ×1 IMPLANT
SUT PDS AB 0 CT 36 (SUTURE) IMPLANT
SUT PDS AB 1 CT 36 (SUTURE) IMPLANT
SUT TIGER TAPE 7 IN WHITE (SUTURE) IMPLANT
SUTURE FIBERWR #2 38 T-5 BLUE (SUTURE) IMPLANT
SUTURE TAPE TIGERLINK 1.3MM BL (SUTURE) IMPLANT
SUTURETAPE TIGERLINK 1.3MM BL (SUTURE) IMPLANT
SYR 5ML LL (SYRINGE) ×1 IMPLANT
TAPE FIBER 2MM 7IN #2 BLUE (SUTURE) IMPLANT
TOWEL GREEN STERILE FF (TOWEL DISPOSABLE) ×2 IMPLANT
TUBE CONNECTING 20X1/4 (TUBING) ×1 IMPLANT
TUBING ARTHROSCOPY IRRIG 16FT (MISCELLANEOUS) ×1 IMPLANT
WAND ABLATOR APOLLO I90 (BUR) ×1 IMPLANT

## 2023-07-11 NOTE — Progress Notes (Signed)
Assisted Dr. Daiva Huge with right, interscalene , ultrasound guided block. Side rails up, monitors on throughout procedure. See vital signs in flow sheet. Tolerated Procedure well.

## 2023-07-11 NOTE — Transfer of Care (Signed)
Immediate Anesthesia Transfer of Care Note  Patient: Theo Dills  Procedure(s) Performed: SHOULDER ARTHROSCOPY WITH SUBACROMIAL DECOMPRESSION, ROTATOR CUFF REPAIR AND BICEP TENDON REPAIR (Right: Shoulder) SHOULDER ARTHROSCOPY WITH DISTAL CLAVICLE EXCISION (Right: Shoulder)  Patient Location: PACU  Anesthesia Type:General  Level of Consciousness: awake and alert   Airway & Oxygen Therapy: Patient Spontanous Breathing and Patient connected to nasal cannula oxygen  Post-op Assessment: Report given to RN and Post -op Vital signs reviewed and stable  Post vital signs: Reviewed and stable  Last Vitals:  Vitals Value Taken Time  BP 142/89 07/11/23 0853  Temp    Pulse 70 07/11/23 0854  Resp 16 07/11/23 0854  SpO2 99 % 07/11/23 0854  Vitals shown include unfiled device data.  Last Pain:  Vitals:   07/11/23 0635  TempSrc: Temporal  PainSc: 2       Patients Stated Pain Goal: 2 (07/11/23 1610)  Complications: No notable events documented.

## 2023-07-11 NOTE — Interval H&P Note (Signed)
All questions answered, patient wants to proceed with procedure. ? ?

## 2023-07-11 NOTE — Op Note (Signed)
Orthopaedic Surgery Operative Note (CSN: [id number])  Kyle Richmond  1976-06-21 Date of Surgery: 07/11/2023   DIAGNOSES: Right shoulder, SLAP tear, biceps tendinitis, AC arthritis, and subacromial impingement.  POST-OPERATIVE DIAGNOSIS: same  PROCEDURE: Arthroscopic extensive debridement - 29823 Subdeltoid Bursa, Supraspinatus Tendon, Anterior Labrum, Superior Labrum, Posterior Labrum, and glenoid bone, glenoid cartilage, humeral bone and humeral cartilage Arthroscopic distal clavicle excision - 40981 Arthroscopic subacromial decompression - 19147 Arthroscopic biceps tenodesis - 82956   OPERATIVE FINDING: Exam under anesthesia: Normal Articular space:  Anterior posterior labral tearing Chondral surfaces: Normal Biceps:  SLAP tear Subscapularis: Intact  Supraspinatus: Intact  Infraspinatus: Intact   Rotator cuff intact, significant biceps inflammation and redness.  Post-operative plan: The patient will be non-weightbearing in a sling 4 weeks.  The patient will be discharged home.  DVT prophylaxis not indicated in ambulatory upper extremity patient without known risk factors.   Pain control with PRN pain medication preferring oral medicines.  Follow up plan will be scheduled in approximately 7 days for incision check and XR.  Surgeons:Primary: Bjorn Pippin, MD Assistants:Caroline McBane, PA-C Location: [facility] OR ROOM 1 Anesthesia: General with Exparel interscalene block Antibiotics: Ancef 3 g Tourniquet time: None Estimated Blood Loss: Minimal Complications: None Specimens: None Implants: Implant Name Type Inv. Item Serial No. Manufacturer Lot No. LRB No. Used Action  ANCHOR SUT 1.8 FIBERTAK SB KL - [id number] Anchor ANCHOR SUT 1.8 FIBERTAK SB KL  [employer] [id number] Right 1 Implanted  ANCHOR SUT 1.8 FIBERTAK SB KL - [id number] Anchor ANCHOR SUT 1.8 [employer] [id number] Right 1 Implanted    Indications for Surgery:   Kyle Richmond is a 48 y.o. male with  continued shoulder pain refractory to nonoperative measures for extended period of time.    The risks and benefits were explained at length including but not limited to continued pain, cuff failure, biceps tenodesis failure, stiffness, need for further surgery and infection.   Procedure:   Patient was correctly identified in the preoperative holding area and operative site marked.  Patient brought to OR and positioned beachchair on an Giddings table ensuring that all bony prominences were padded and the head was in an appropriate location.  Anesthesia was induced and the operative shoulder was prepped and draped in the usual sterile fashion.  Timeout was called preincision.  A standard posterior viewing portal was made after localizing the portal with a spinal needle.  An anterior accessory portal was also made.  After clearing the articular space the camera was positioned in the subacromial space.  Findings above.    Extensive debridement was performed of the anterior interval tissue, labral fraying and the bursa.  Glenoid bone, glenoid cartilage, humeral bone were all debrided.  Subacromial decompression: We made a lateral portal with spinal needle guidance. We then proceeded to debride bursal tissue extensively with a shaver and arthrocare device. At that point we continued to identify the borders of the acromion and identify the spur. We then carefully preserved the deltoid fascia and used a burr to convert the acromion to a Type 1 flat acromion without issue.  Biceps tenodesis: We marked the tendon and then performed a tenotomy and debridement of the stump in the articular space. We then identified the biceps tendon in its groove suprapec with the arthroscope in the lateral portal taking care to move from lateral to medial to avoid injury to the subscapularis. At that point we unroofed the tendon itself and mobilized it. An accessory anterior  portal was made in line with the tendon and we grasped it  from the anterior superior portal and worked from the accessory anterior portal. Two Fibertak 1.58mm knotless anchors were placed in the groove and the tendon was secured in a luggage loop style fashion with a pass of the limb of suture through the tendon using a scorpion device to avoid pull-through.  Repair was completed with good tension on the tendon.  Residual stump of the tendon was removed after being resected with a RF ablator.  Distal Clavicle resection:  The scope was placed in the subacromial space from the posterior portal.  A hemostat was placed through the anterior portal and we spread at the [facility] joint.  A burr was then inserted and 10 mm of distal clavicle was resected taking care to avoid damage to the capsule around the joint and avoiding overhanging bone posteriorly.      The incisions were closed with absorbable monocryl and steri strips.  A sterile dressing was placed along with a sling. The patient was awoken from general anesthesia and taken to the PACU in stable condition without complication.   Alfonse Alpers, PA-C, present and scrubbed throughout the case, critical for completion in a timely fashion, and for retraction, instrumentation, closure.

## 2023-07-11 NOTE — Anesthesia Preprocedure Evaluation (Addendum)
Anesthesia Evaluation  Patient identified by MRN, date of birth, ID band Patient awake    Reviewed: Allergy & Precautions, NPO status , Patient's Chart, lab work & pertinent test results  History of Anesthesia Complications Negative for: history of anesthetic complications  Airway Mallampati: III  TM Distance: >3 FB Neck ROM: Full    Dental no notable dental hx.    Pulmonary neg pulmonary ROS   Pulmonary exam normal        Cardiovascular hypertension, Pt. on medications Normal cardiovascular exam     Neuro/Psych negative neurological ROS     GI/Hepatic negative GI ROS, Neg liver ROS,,,  Endo/Other  Hypothyroidism  Class 3 obesity  Renal/GU negative Renal ROS  negative genitourinary   Musculoskeletal right shoulder cartilage disorder, impingement syndrome, biceps tendonitis, OA, rotator cuff tear   Abdominal   Peds  Hematology negative hematology ROS (+)   Anesthesia Other Findings Day of surgery medications reviewed with patient.  Reproductive/Obstetrics                             Anesthesia Physical Anesthesia Plan  ASA: 3  Anesthesia Plan: General   Post-op Pain Management: Tylenol PO (pre-op)* and Regional block*   Induction: Intravenous  PONV Risk Score and Plan: 2 and Treatment may vary due to age or medical condition, Ondansetron, Dexamethasone and Midazolam  Airway Management Planned: Oral ETT  Additional Equipment: None  Intra-op Plan:   Post-operative Plan: Extubation in OR  Informed Consent: I have reviewed the patients History and Physical, chart, labs and discussed the procedure including the risks, benefits and alternatives for the proposed anesthesia with the patient or authorized representative who has indicated his/her understanding and acceptance.     Dental advisory given  Plan Discussed with: CRNA  Anesthesia Plan Comments:         Anesthesia  Quick Evaluation

## 2023-07-11 NOTE — Anesthesia Procedure Notes (Signed)
Procedure Name: Intubation Date/Time: 07/11/2023 7:58 AM  Performed by: Montez Morita, Aaliyan Brinkmeier W, CRNAPre-anesthesia Checklist: Patient identified, Emergency Drugs available, Suction available and Patient being monitored Patient Re-evaluated:Patient Re-evaluated prior to induction Oxygen Delivery Method: Circle system utilized Preoxygenation: Pre-oxygenation with 100% oxygen Induction Type: IV induction Ventilation: Mask ventilation without difficulty Laryngoscope Size: Miller, 2 and Glidescope Grade View: Grade II Tube type: Oral Tube size: 7.5 mm Number of attempts: 3 Airway Equipment and Method: Stylet, Oral airway and Video-laryngoscopy Placement Confirmation: ETT inserted through vocal cords under direct vision, positive ETCO2 and breath sounds checked- equal and bilateral Secured at: 25 cm Tube secured with: Tape Dental Injury: Teeth and Oropharynx as per pre-operative assessment  Difficulty Due To: Difficult Airway- due to large tongue Future Recommendations: Recommend- induction with short-acting agent, and alternative techniques readily available Comments: DL Miller 2, grade 3 view. DL 3 glide scope, grade 3 view. DL 4 glide scope, grade one view, ETT passes with ease thru vocal cords. +eTCO2, =BBS. Easy 2 hand mask vent throughout attempts, sats 96-100%

## 2023-07-11 NOTE — Anesthesia Procedure Notes (Signed)
Anesthesia Regional Block: Interscalene brachial plexus block   Pre-Anesthetic Checklist: , timeout performed,  Correct Patient, Correct Site, Correct Laterality,  Correct Procedure, Correct Position, site marked,  Risks and benefits discussed,  Pre-op evaluation,  At surgeon's request and post-op pain management  Laterality: Right  Prep: Maximum Sterile Barrier Precautions used, chloraprep       Needles:  Injection technique: Single-shot  Needle Type: Echogenic Stimulator Needle     Needle Length: 4cm  Needle Gauge: 22     Additional Needles:   Procedures:,,,, ultrasound used (permanent image in chart),,    Narrative:  Start time: 07/11/2023 7:14 AM End time: 07/11/2023 7:17 AM Injection made incrementally with aspirations every 5 mL.  Performed by: Personally  Anesthesiologist: Kaylyn Layer, MD  Additional Notes: Risks, benefits, and alternative discussed. Patient gave consent for procedure. Patient prepped and draped in sterile fashion. Sedation administered, patient remains easily responsive to voice. Relevant anatomy identified with ultrasound guidance. Local anesthetic given in 5cc increments with no signs or symptoms of intravascular injection. No pain or paraesthesias with injection. Patient monitored throughout procedure with signs of LAST or immediate complications. Tolerated well. Ultrasound image placed in chart.  Kyle Greenhouse, MD

## 2023-07-11 NOTE — Anesthesia Postprocedure Evaluation (Signed)
Anesthesia Post Note  Patient: Kyle Richmond  Procedure(s) Performed: SHOULDER ARTHROSCOPY WITH SUBACROMIAL DECOMPRESSION, ROTATOR CUFF REPAIR AND BICEP TENDON REPAIR (Right: Shoulder) SHOULDER ARTHROSCOPY WITH DISTAL CLAVICLE EXCISION (Right: Shoulder)     Patient location during evaluation: PACU Anesthesia Type: General Level of consciousness: awake and alert Pain management: pain level controlled Vital Signs Assessment: post-procedure vital signs reviewed and stable Respiratory status: spontaneous breathing, nonlabored ventilation and respiratory function stable Cardiovascular status: blood pressure returned to baseline Postop Assessment: no apparent nausea or vomiting Anesthetic complications: no   No notable events documented.  Last Vitals:  Vitals:   07/11/23 0900 07/11/23 0915  BP: 131/75 127/89  Pulse: 65 69  Resp: 13 12  Temp:    SpO2: 100% 100%    Last Pain:  Vitals:   07/11/23 0915  TempSrc:   PainSc: 0-No pain                 Shanda Howells

## 2023-07-12 ENCOUNTER — Encounter (HOSPITAL_BASED_OUTPATIENT_CLINIC_OR_DEPARTMENT_OTHER): Payer: Self-pay | Admitting: Orthopaedic Surgery

## 2024-01-06 ENCOUNTER — Other Ambulatory Visit: Payer: Self-pay | Admitting: Medical Genetics

## 2024-04-10 ENCOUNTER — Encounter (HOSPITAL_COMMUNITY): Payer: Self-pay | Admitting: *Deleted

## 2024-04-13 ENCOUNTER — Other Ambulatory Visit: Payer: Self-pay | Admitting: Medical Genetics

## 2024-04-13 DIAGNOSIS — Z006 Encounter for examination for normal comparison and control in clinical research program: Secondary | ICD-10-CM

## 2024-07-05 LAB — GENECONNECT MOLECULAR SCREEN: Genetic Analysis Overall Interpretation: NEGATIVE
